# Patient Record
Sex: Male | Born: 1965 | Race: White | Hispanic: No
Health system: Midwestern US, Community
[De-identification: ages and names within clinical notes are randomized; demographics above are authoritative.]

## PROBLEM LIST (undated history)

## (undated) DIAGNOSIS — M5416 Radiculopathy, lumbar region: Secondary | ICD-10-CM

## (undated) DIAGNOSIS — M545 Low back pain, unspecified: Secondary | ICD-10-CM

## (undated) DIAGNOSIS — F429 Obsessive-compulsive disorder, unspecified: Secondary | ICD-10-CM

## (undated) DIAGNOSIS — T8859XA Other complications of anesthesia, initial encounter: Secondary | ICD-10-CM

## (undated) DIAGNOSIS — T4145XA Adverse effect of unspecified anesthetic, initial encounter: Secondary | ICD-10-CM

## (undated) DIAGNOSIS — G709 Myoneural disorder, unspecified: Secondary | ICD-10-CM

## (undated) DIAGNOSIS — F32A Depression, unspecified: Secondary | ICD-10-CM

## (undated) DIAGNOSIS — K269 Duodenal ulcer, unspecified as acute or chronic, without hemorrhage or perforation: Secondary | ICD-10-CM

## (undated) DIAGNOSIS — F419 Anxiety disorder, unspecified: Secondary | ICD-10-CM

## (undated) DIAGNOSIS — F909 Attention-deficit hyperactivity disorder, unspecified type: Secondary | ICD-10-CM

## (undated) DIAGNOSIS — F329 Major depressive disorder, single episode, unspecified: Secondary | ICD-10-CM

## (undated) DIAGNOSIS — F319 Bipolar disorder, unspecified: Secondary | ICD-10-CM

## (undated) HISTORY — DX: Depression, unspecified: F32.A

## (undated) HISTORY — DX: Myoneural disorder, unspecified: G70.9

## (undated) HISTORY — PX: COLON SURGERY: SHX602

## (undated) HISTORY — DX: Major depressive disorder, single episode, unspecified: F32.9

## (undated) HISTORY — PX: FRACTURE SURGERY: SHX138

## (undated) HISTORY — DX: Anxiety disorder, unspecified: F41.9

---
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
## 2008-06-07 HISTORY — PX: SHOULDER ARTHROSCOPY DISTAL CLAVICLE EXCISION AND OPEN ROTATOR CUFF REPAIR: SHX2396

## 2009-05-03 ENCOUNTER — Emergency Department (HOSPITAL_BASED_OUTPATIENT_CLINIC_OR_DEPARTMENT_OTHER): Admission: EM | Admit: 2009-05-03 | Discharge: 2009-05-03 | Payer: Self-pay | Admitting: Emergency Medicine

## 2009-05-03 ENCOUNTER — Ambulatory Visit: Payer: Self-pay | Admitting: Diagnostic Radiology

## 2011-02-06 ENCOUNTER — Encounter: Payer: Self-pay | Admitting: *Deleted

## 2011-02-06 ENCOUNTER — Emergency Department (HOSPITAL_BASED_OUTPATIENT_CLINIC_OR_DEPARTMENT_OTHER)
Admission: EM | Admit: 2011-02-06 | Discharge: 2011-02-06 | Disposition: A | Discharge status: Home / Self Care | Payer: Managed Care, Other (non HMO) | Attending: Emergency Medicine | Admitting: Emergency Medicine

## 2011-02-06 DIAGNOSIS — S81009A Unspecified open wound, unspecified knee, initial encounter: Secondary | ICD-10-CM | POA: Insufficient documentation

## 2011-02-06 DIAGNOSIS — Y9289 Other specified places as the place of occurrence of the external cause: Secondary | ICD-10-CM | POA: Insufficient documentation

## 2011-02-06 DIAGNOSIS — S81019A Laceration without foreign body, unspecified knee, initial encounter: Secondary | ICD-10-CM

## 2011-02-06 DIAGNOSIS — S91009A Unspecified open wound, unspecified ankle, initial encounter: Secondary | ICD-10-CM | POA: Insufficient documentation

## 2011-02-06 DIAGNOSIS — W268XXA Contact with other sharp object(s), not elsewhere classified, initial encounter: Secondary | ICD-10-CM | POA: Insufficient documentation

## 2011-02-06 MED ORDER — LIDOCAINE HCL 2 % IJ SOLN
15.0000 mL | Freq: Once | INTRAMUSCULAR | Status: AC
  Administered 2011-02-06: 22:00:00

## 2011-02-06 MED ORDER — LIDOCAINE HCL 2 % IJ SOLN
INTRAMUSCULAR | Status: AC
  Filled 2011-02-06: qty 1

## 2011-02-06 NOTE — ED Provider Notes (Signed)
Medical screening examination/treatment/procedure(s) were performed by non-physician practitioner and as supervising physician I was immediately available for consultation/collaboration.   Lorie Melichar, MD 02/06/11 2320 
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
## 2011-02-06 NOTE — ED Notes (Signed)
D/c home with instructions to have sutures out in 10-14 days- no rx given- no activity restrictions per EDNP Lonna Cobb

## 2011-02-06 NOTE — ED Notes (Signed)
Pt sutured by V. Pickering,EDNP
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
## 2011-11-06 ENCOUNTER — Emergency Department (HOSPITAL_BASED_OUTPATIENT_CLINIC_OR_DEPARTMENT_OTHER)
Admission: EM | Admit: 2011-11-06 | Discharge: 2011-11-06 | Disposition: A | Discharge status: Home / Self Care | Payer: Managed Care, Other (non HMO) | Attending: Emergency Medicine | Admitting: Emergency Medicine

## 2011-11-06 ENCOUNTER — Encounter (HOSPITAL_BASED_OUTPATIENT_CLINIC_OR_DEPARTMENT_OTHER): Payer: Self-pay | Admitting: *Deleted

## 2011-11-06 DIAGNOSIS — F909 Attention-deficit hyperactivity disorder, unspecified type: Secondary | ICD-10-CM | POA: Insufficient documentation

## 2011-11-06 DIAGNOSIS — F429 Obsessive-compulsive disorder, unspecified: Secondary | ICD-10-CM | POA: Insufficient documentation

## 2011-11-06 DIAGNOSIS — Z760 Encounter for issue of repeat prescription: Secondary | ICD-10-CM | POA: Insufficient documentation

## 2011-11-06 DIAGNOSIS — F319 Bipolar disorder, unspecified: Secondary | ICD-10-CM | POA: Insufficient documentation

## 2011-11-06 HISTORY — DX: Obsessive-compulsive disorder, unspecified: F42.9

## 2011-11-06 HISTORY — DX: Bipolar disorder, unspecified: F31.9

## 2011-11-06 HISTORY — DX: Attention-deficit hyperactivity disorder, unspecified type: F90.9

## 2011-11-06 MED ORDER — AMPHETAMINE-DEXTROAMPHETAMINE 20 MG PO TABS: ORAL_TABLET | ORAL | Status: DC

## 2011-11-06 NOTE — ED Notes (Signed)
Pt reports he is out of adderall- laast dose at 1200 today- pt's PA called and spoke with Karen Kitchens, RN regarding pt's situation

## 2011-11-06 NOTE — Discharge Instructions (Signed)
Medication Refill, Emergency Department We have refilled your medication today as a courtesy to you. It is best for your medical care, however, to take care of getting refills done through your primary caregiver's office. They have your records and can do a better job of follow-up than we can in the emergency department. On maintenance medications, we often only prescribe enough medications to get you by until you are able to see your regular caregiver. This is a more expensive way to refill medications. In the future, please plan for refills so that you will not have to use the emergency department for this. Thank you for your help. Your help allows [place] to better take care of the daily emergencies that enter our department. Document Released: 09/10/2003 Document Revised: 05/13/2011 Document Reviewed: 05/24/2005 [facility] Patient Information 2012 [place], [place].   Take the adderal as prescribed.  Follow up with your MD as planned.

## 2011-11-06 NOTE — ED Provider Notes (Signed)
History     CSN: [id number]  Arrival date & time 11/06/11  1943   First MD Initiated Contact with Patient 11/06/11 2050      Chief Complaint  Patient presents with  . Medication Refill    (Consider location/radiation/quality/duration/timing/severity/associated sxs/prior treatment) HPI Comments: Pt ran out of adderal.  His prescriber called the ED to inform us that he is a legitimate pt of their practice and does indeed need the medicine.  The history is provided by the patient. No language interpreter was used.    Past Medical History  Diagnosis Date  . ADHD (attention deficit hyperactivity disorder)   . Bipolar 1 disorder   . OCD (obsessive compulsive disorder)     Past Surgical History  Procedure Date  . Arm surgery     No family history on file.  History  Substance Use Topics  . Smoking status: Never Smoker   . Smokeless tobacco: Current User    Types: Snuff  . Alcohol Use: 3.0 oz/week    5 Cans of beer per week      Review of Systems  Psychiatric/Behavioral: Negative for suicidal ideas and sleep disturbance. The patient is nervous/anxious.   All other systems reviewed and are negative.    Allergies  Review of patient's allergies indicates no known allergies.  Home Medications   Current Outpatient Rx  Name Route Sig Dispense Refill  . ALPRAZOLAM 1 MG PO TABS Oral Take 1 mg by mouth 3 (three) times daily as needed. For anxiety    . AMPHETAMINE-DEXTROAMPHETAMINE 20 MG PO TABS Oral Take 20 mg by mouth 3 (three) times daily.    Marland Kitchen CLONAZEPAM 0.5 MG PO TABS Oral Take 0.5 mg by mouth 3 (three) times daily as needed. For anxiety    . IBUPROFEN 200 MG PO TABS Oral Take 800 mg by mouth 3 (three) times daily as needed. For pain    . LAMOTRIGINE ER 25 MG PO TB24 Oral Take 25 mg by mouth daily.    . VENLAFAXINE HCL ER 150 MG PO CP24 Oral Take 150 mg by mouth daily.    . AMPHETAMINE-DEXTROAMPHETAMINE 20 MG PO TABS  One tab po QID 120 tablet 0    BP 139/81   Pulse 64  Temp 98.3 F (36.8 C)  Resp 18  Ht 6\' 2"  (1.88 m)  Wt 215 lb (97.523 kg)  BMI 27.60 kg/m2  SpO2 100%  Physical Exam  Nursing note and vitals reviewed. Constitutional: He is oriented to person, place, and time. He appears well-developed and well-nourished.  HENT:  Head: Normocephalic and atraumatic.  Eyes: EOM are normal.  Neck: Normal range of motion.  Cardiovascular: Normal rate, regular rhythm, normal heart sounds and intact distal pulses.   Pulmonary/Chest: Effort normal and breath sounds normal. No respiratory distress.  Abdominal: Soft. He exhibits no distension. There is no tenderness.  Musculoskeletal: Normal range of motion.  Neurological: He is alert and oriented to person, place, and time.  Skin: Skin is warm and dry.  Psychiatric: His speech is normal and behavior is normal. Judgment and thought content normal. His mood appears anxious. His affect is not angry, not blunt, not labile and not inappropriate. Thought content is not paranoid and not delusional. He does not exhibit a depressed mood. He expresses no homicidal and no suicidal ideation.    ED Course  Procedures (including critical care time)  Labs Reviewed - No data to display No results found.   1. Prescription refill  MDM  rx-adderal 20 mg QID, 120 F/u with your MD as planned.      Pt ra  Worthy Rancher, [place] 11/06/11 2138

## 2011-11-06 NOTE — ED Notes (Signed)
rx x 1 given for adderral

## 2011-11-06 NOTE — ED Provider Notes (Signed)
Medical screening examination/treatment/procedure(s) were performed by non-physician practitioner and as supervising physician I was immediately available for consultation/collaboration.   Coltyn Hanning, MD 11/06/11 2326 

## 2011-11-07 ENCOUNTER — Emergency Department (HOSPITAL_BASED_OUTPATIENT_CLINIC_OR_DEPARTMENT_OTHER)
Admission: EM | Admit: 2011-11-07 | Discharge: 2011-11-08 | Disposition: A | Discharge status: Home / Self Care | Payer: Managed Care, Other (non HMO) | Attending: Emergency Medicine | Admitting: Emergency Medicine

## 2011-11-07 ENCOUNTER — Encounter (HOSPITAL_BASED_OUTPATIENT_CLINIC_OR_DEPARTMENT_OTHER): Payer: Self-pay | Admitting: *Deleted

## 2011-11-07 DIAGNOSIS — R0682 Tachypnea, not elsewhere classified: Secondary | ICD-10-CM | POA: Insufficient documentation

## 2011-11-07 DIAGNOSIS — F3110 Bipolar disorder, current episode manic without psychotic features, unspecified: Secondary | ICD-10-CM

## 2011-11-07 DIAGNOSIS — F22 Delusional disorders: Secondary | ICD-10-CM

## 2011-11-07 DIAGNOSIS — F411 Generalized anxiety disorder: Secondary | ICD-10-CM | POA: Insufficient documentation

## 2011-11-07 LAB — CBC
HCT: 44.2 % (ref 39.0–52.0)
Hemoglobin: 16.2 g/dL (ref 13.0–17.0)
MCH: 31.9 pg (ref 26.0–34.0)
MCHC: 36.7 g/dL — ABNORMAL HIGH (ref 30.0–36.0)
MCV: 87 fL (ref 78.0–100.0)
Platelets: 148 K/uL — ABNORMAL LOW (ref 150–400)
RBC: 5.08 MIL/uL (ref 4.22–5.81)
RDW: 12.8 % (ref 11.5–15.5)
WBC: 6.5 10*3/uL (ref 4.0–10.5)

## 2011-11-07 LAB — BASIC METABOLIC PANEL WITH GFR
Calcium: 9.2 mg/dL (ref 8.4–10.5)
GFR calc non Af Amer: 89 mL/min — ABNORMAL LOW (ref >90)
Sodium: 138 meq/L (ref 135–145)

## 2011-11-07 LAB — RAPID URINE DRUG SCREEN, HOSP PERFORMED
Amphetamines: POSITIVE — AB
Barbiturates: NOT DETECTED
Benzodiazepines: NOT DETECTED
Cocaine: NOT DETECTED
Opiates: NOT DETECTED
Tetrahydrocannabinol: NOT DETECTED

## 2011-11-07 LAB — BASIC METABOLIC PANEL
BUN: 19 mg/dL (ref 6–23)
CO2: 30 mEq/L (ref 19–32)
Chloride: 100 mEq/L (ref 96–112)
Creatinine, Ser: 1 mg/dL (ref 0.50–1.35)
GFR calc Af Amer: >90 mL/min (ref >90)
Glucose, Bld: 100 mg/dL — ABNORMAL HIGH (ref 70–99)
Potassium: 3.8 mEq/L (ref 3.5–5.1)

## 2011-11-07 LAB — ETHANOL: Alcohol, Ethyl (B): <11 mg/dL (ref 0–11)

## 2011-11-07 MED ORDER — ACETAMINOPHEN 325 MG PO TABS
650.0000 mg | ORAL_TABLET | ORAL | Status: DC | PRN
  Administered 2011-11-07: 650 mg via ORAL
  Filled 2011-11-07: qty 2

## 2011-11-07 MED ORDER — NICOTINE 21 MG/24HR TD PT24
21.0000 mg | MEDICATED_PATCH | Freq: Every day | TRANSDERMAL | Status: DC
  Administered 2011-11-07: 21 mg via TRANSDERMAL
  Filled 2011-11-07 (×2): qty 1

## 2011-11-07 MED ORDER — LORAZEPAM 1 MG PO TABS
1.0000 mg | ORAL_TABLET | Freq: Three times a day (TID) | ORAL | Status: DC | PRN
  Administered 2011-11-07 – 2011-11-08 (×3): 1 mg via ORAL
  Filled 2011-11-07 (×3): qty 1

## 2011-11-07 MED ORDER — ALUM & MAG HYDROXIDE-SIMETH 200-200-20 MG/5ML PO SUSP: 30.0000 mL | ORAL | Status: DC | PRN

## 2011-11-07 MED ORDER — ONDANSETRON HCL 4 MG PO TABS: 4.0000 mg | ORAL_TABLET | Freq: Three times a day (TID) | ORAL | Status: DC | PRN

## 2011-11-07 NOTE — ED Notes (Signed)
[facility] staff here speaking with pt

## 2011-11-07 NOTE — ED Notes (Signed)
Wife, Loranzo Desha can be reached at [phone]

## 2011-11-07 NOTE — ED Notes (Signed)
Pt drinking Gatorade in an attempt to provide UA

## 2011-11-07 NOTE — ED Notes (Signed)
Report to [place], [place]

## 2011-11-07 NOTE — ED Notes (Signed)
EDP Ricardo Robbins notified that [facility] is working on placing pt and has already assessed the pt

## 2011-11-07 NOTE — ED Notes (Signed)
Visitor at bedside is Clance Boll, [place] with [facility] [employer] (according to badge she is wearing); of note, she introduced herself to me twice within a short time time period (approx 20 min) in the same manner, offering her hand to shake while stating, "I'm Marisue Ivan".  The second time she did this, she interrupted pt while he was talking.  She then told me, "His wife is [place]"

## 2011-11-07 NOTE — ED Provider Notes (Signed)
History     CSN: [id number]  Arrival date & time 11/07/11  0504   First MD Initiated Contact with Patient 11/07/11 [phone]      Chief Complaint  Patient presents with  . Medical Clearance    (Consider location/radiation/quality/duration/timing/severity/associated sxs/prior treatment) HPI Comments: Level 5 caveat due to psychiatric complaint and behavior.  Pt has had worsening problems with sleep, working too long hours, up to 300 hrs in 3 weeks, inability to function at work and having thoughts that others are conspiring against him.  He has purposefully trying to do a poor performance recently in order to get fired.  Physically, he is tired, has had episodes of nearly passing out but no HA, CP, SOB.  No abd pain, fevers, vomiting, diarrhea.  He also has been intermittently skipping or running out of his meds for past several months.  Last saw his psychiatrist about 4-5 months ago, missed his last appt on Friday.    The history is provided by the patient, a friend and the spouse.    Past Medical History  Diagnosis Date  . ADHD (attention deficit hyperactivity disorder)   . Bipolar 1 disorder   . OCD (obsessive compulsive disorder)     Past Surgical History  Procedure Date  . Arm surgery     History reviewed. No pertinent family history.  History  Substance Use Topics  . Smoking status: Never Smoker   . Smokeless tobacco: Current User    Types: Snuff  . Alcohol Use: 3.0 oz/week    5 Cans of beer per week      Review of Systems  Unable to perform ROS: Psychiatric disorder    Allergies  Review of patient's allergies indicates no known allergies.  Home Medications   Current Outpatient Rx  Name Route Sig Dispense Refill  . ALPRAZOLAM 1 MG PO TABS Oral Take 1 mg by mouth 3 (three) times daily as needed. For anxiety    . AMPHETAMINE-DEXTROAMPHETAMINE 20 MG PO TABS Oral Take 20 mg by mouth 3 (three) times daily.    . AMPHETAMINE-DEXTROAMPHETAMINE 20 MG PO TABS  One tab  po QID 120 tablet 0  . CLONAZEPAM 0.5 MG PO TABS Oral Take 0.5 mg by mouth 3 (three) times daily as needed. For anxiety    . IBUPROFEN 200 MG PO TABS Oral Take 800 mg by mouth 3 (three) times daily as needed. For pain    . LAMOTRIGINE ER 25 MG PO TB24 Oral Take 25 mg by mouth daily.    . VENLAFAXINE HCL ER 150 MG PO CP24 Oral Take 150 mg by mouth daily.      BP 150/87  Pulse 60  Temp(Src) 97.9 F (36.6 C) (Oral)  Resp 18  SpO2 97%  Physical Exam  Nursing note and vitals reviewed. Constitutional: He appears well-developed and well-nourished. He appears distressed.  HENT:  Head: Normocephalic and atraumatic.  Eyes: Conjunctivae and EOM are normal. Pupils are equal, round, and reactive to light. No scleral icterus.  Neck: Neck supple.  Cardiovascular: Normal rate.   Pulmonary/Chest: Breath sounds normal. Tachypnea noted. No respiratory distress. He has no wheezes.  Abdominal: Soft.  Musculoskeletal: He exhibits no edema.       Atrophy of left hand due to prior injury and surgery  Neurological: He is alert. No cranial nerve deficit. GCS eye subscore is 4. GCS verbal subscore is 5. GCS motor subscore is 6.  Skin: Skin is warm. He is diaphoretic.  Psychiatric: His mood appears  anxious. His affect is labile. His speech is rapid and/or pressured, delayed and tangential. He is agitated. Thought content is paranoid and delusional. He expresses impulsivity and inappropriate judgment. He expresses no homicidal and no suicidal ideation. He expresses no suicidal plans and no homicidal plans.    ED Course  Procedures (including critical care time)   Labs Reviewed  CBC  BASIC METABOLIC PANEL  ETHANOL  URINE RAPID DRUG SCREEN (HOSP PERFORMED)   No results found.   1. Bipolar I disorder with mania   2. Paranoid delusion       MDM  Pt with manic features along with paranoia, self destructive behavior recently at work.  I recommend inpatient treatment for uncontrolled mania and bipolar  disorder with paranoid features. Will get drug screen, labs, and consult [facility] to see pt here.  Pt is signed off to Dr. Fonnie Jarvis.          Gavin Pound. Resa Rinks, MD 11/07/11 1610

## 2011-11-07 NOTE — ED Notes (Signed)
Wife just left

## 2011-11-07 NOTE — ED Notes (Signed)
Ricardo Robbins with [facility] has updated pt on progress.  Information has been faxed to inpatient, awaiting acceptance and  placement.

## 2011-11-07 NOTE — ED Notes (Signed)
[facility] staff still at bedside

## 2011-11-07 NOTE — ED Notes (Signed)
Misty Stanley with [facility] has evaluated pt and is presently seeking in-patient placement for pt.  Voluntary admission.

## 2011-11-07 NOTE — ED Provider Notes (Addendum)
Seen by [facility] counselor who recs [facility] and working on voluntary (Pt agrees) transfer arrangements.1130 [facility] currently full, but Pt still on waiting list there as well as under review at multiple other facilities per [facility]. 1525  Hurman Horn, MD 11/07/11 1528  Pt anxious but cooperative; since no [facility] beds available will transfer to [facility] ED for ongoing ED voluntary psych hold while awaiting inpatient psych bed availability.  Pt agrees to transfer to [facility] ED.1630  Hurman Horn, MD 11/07/11 [phone]

## 2011-11-07 NOTE — ED Notes (Signed)
Per EDP Freida Busman, pt may have a nicotine patch.

## 2011-11-07 NOTE — ED Notes (Signed)
Pt was seen earlier after receiving a call from an individual that identified herself as a PA with [facility]. This individual stated that the pt was on adderall and ran out of his rx and missed his appointment on Friday. Pt was advised to go to ED for his rx since offices were closed. This individual led this RN to believe that she was pts PCP and prescriber of his meds. Pt returns to ED because he "lost about 6 hours" after leaving the ED. Pt is talking rapidly and at times incoherently. Pt states that he left here to get his rx filled. Pt pulled his car over to write an "email" and woke up several hours later. Pt reports several episodes of syncope in the last week. Pt has been out of meds x one week. Shortly after pt's arrival to ED the individual who identified herself as a PA with [facility] arrived and stated that she was in fact a friend, and had been boating with pt and witnessed a "pre- syncopal" episode earlier in the day (Sat), and that she feels like something" organic" is going on.

## 2011-11-07 NOTE — [facility] Counselor (Signed)
Telepsych consult ordered and info faxed to [facility].

## 2011-11-07 NOTE — ED Notes (Signed)
[id number]<FA> Expected date:<BR> Expected time:<BR> Means of arrival:<BR> Comments:<BR> Hold for charge

## 2011-11-07 NOTE — ED Notes (Signed)
I received a call from someone who stated that they were Mr Kattner PA who wanted to speak with someone about his care. I transferred that phone call to the charge nurse, Karen Kitchens.

## 2011-11-07 NOTE — ED Notes (Signed)
Pt family/friend/PA   reported  to RN station requesting medication for pt. States that pt has a HA.  This RN was giving report to [facility] team and then to oncoming RN when family friend reported again stating that pt needed something to calm his nerves as well. Informed friend that he did have PRN orders  And that I would bring those in shortly. Friend appeared upset and asked if I knew how long it would be, I responded that I would bring medication after giving the oncoming RN report.

## 2011-11-07 NOTE — ED Notes (Addendum)
Pt's wife took his belongings.

## 2011-11-08 MED ORDER — VENLAFAXINE HCL ER 150 MG PO CP24: 150.0000 mg | ORAL_CAPSULE | Freq: Every day | ORAL | Status: DC

## 2011-11-08 MED ORDER — LAMOTRIGINE 25 MG PO TABS: 25.0000 mg | ORAL_TABLET | Freq: Every day | ORAL | Status: DC

## 2011-11-08 NOTE — [facility] Counselor (Addendum)
Writer spoke with pt to discuss telepsych's consult recommendation for d/c. Pt wishes to remain in psych ed with hope of being admitted inpatient somewhere. Writer relayed info from [place] at [employer] that pt had been declined by 2 hospitals so far due to not meeting inpt criteria. Writer told pt that pt could possibly be d/c today and to follow up with his psychiatrist if no bed is found for pt. Writer spoke with Misty Stanley at [facility] to tell her telepsych results and told her pt's request. Misty Stanley indicates she will continue to work on placing pt.

## 2011-11-08 NOTE — Discharge Instructions (Signed)
Mood Disorders Mood disorders are conditions that affect the way a person feels emotionally. The main mood disorders include:  Depression.   Bipolar disorder.   Dysthymia. Dysthymia is a mild, lasting (chronic) depression. Symptoms of dysthymia are similar to depression, but not as severe.   Cyclothymia. Cyclothymia includes mood swings, but the highs and lows are not as severe as they are in bipolar disorder. Symptoms of cyclothymia are similar to those of bipolar disorder, but less extreme.  CAUSES  Mood disorders are probably caused by a combination of factors. People with mood disorders seem to have physical and chemical changes in their brains. Mood disorders run in families, so there may be genetic causes. Severe trauma or stressful life events may also increase the risk of mood disorders.  SYMPTOMS  Symptoms of mood disorders depend on the specific type of condition. Depression symptoms include:  Feeling sad, worthless, or hopeless.   Negative thoughts.   Inability to enjoy one's usual activities.   Low energy.   Sleeping too much or too little.   Appetite changes.   Crying.   Concentration problems.   Thoughts of harming oneself.  Bipolar disorder symptoms include:  Periods of depression (see above symptoms).   Mood swings, from sadness and depression, to abnormal elation and excitement.   Periods of mania:   Racing thoughts.   Fast speech.   Poor judgment, and careless, dangerous choices.   Decreased need for sleep.   Risky behavior.   Difficulty concentrating.   Irritability.   Increased energy.   Increased sex drive.  DIAGNOSIS  There are no blood tests or X-rays that can confirm a mood disorder. However, your caregiver may choose to run some tests to make sure that there is not another physical cause for your symptoms. A mood disorder is usually diagnosed after an in-depth interview with a caregiver. TREATMENT  Mood disorders can be treated  with one or more of the following:  Medicine. This may include antidepressants, mood-stabilizers, or anti-psychotics.   Psychotherapy (talk therapy).   Cognitive behavioral therapy. You are taught to recognize negative thoughts and behavior patterns, and replace them with healthy thoughts and behaviors.   Electroconvulsive therapy. For very severe cases of deep depression, a series of treatments in which an electrical current is applied to the brain.   Vagus nerve stimulation. A pulse of electricity is applied to a portion of the brain.   Transcranial magnetic stimulation. Powerful magnets are placed on the head that produce electrical currents.   Hospitalization. In severe situations, or when someone is having serious thoughts of harming him or herself, hospitalization may be necessary in order to keep the person safe. This is also done to quickly start and monitor treatment.  HOME CARE INSTRUCTIONS   Take your medicine exactly as directed.   Attend all of your therapy sessions.   Try to eat regular, healthy meals.   Exercise daily. Exercise may improve mood symptoms.   Get good sleep.   Do not drink alcohol or use pot or other drugs. These can worsen mood symptoms and cause anxiety and psychosis.   Tell your caregiver if you develop any side effects, such as feeling sick to your stomach (nauseous), dry mouth, dizziness, constipation, drowsiness, tremor, weight gain, or sexual symptoms. He or she may suggest things you can do to improve symptoms.   Learn ways to cope with the stress of having a chronic illness. This includes yoga, meditation, tai chi, or participating in a support  group.   Drink enough water to keep your urine clear or pale yellow. Eat a high-fiber diet. These habits may help you avoid constipation from your medicine.  SEEK IMMEDIATE MEDICAL CARE IF:  Your mood worsens.   You have thoughts of hurting yourself or others.   You cannot care for yourself.   You  develop the sensation of hearing or seeing something that is not actually present (auditory or visual hallucinations).   You develop abnormal thoughts.  Document Released: 03/21/2009 Document Revised: 05/13/2011 Document Reviewed: 03/21/2009 [facility] Patient Information 2012 [place], [place].  RESOURCE GUIDE  Chronic Pain Problems: Contact [facility]  [phone] Patients need to be referred by their primary care doctor.  Insufficient Money for Medicine: Contact United Way:  call "211" or [facility] [phone].  No Primary Care Doctor: - Call [facility] Connect  [phone] - can help you locate a primary care doctor that  accepts your insurance, provides certain services, etc. - Physician Referral Service- [phone]  Agencies that provide inexpensive medical care: - [facility]  [phone] - [facility]  [phone] - [facility]  [phone] - Women's Clinic  [phone] - Planned Parenthood  [phone] [facility]  [phone]  Medicaid-accepting [facility] Providers: - [facility]- [address]  [phone], Mon-Fri 9am-7pm, Sat 9am-1pm - [facility]- [address] [place], [address] [place]  [phone] - [facility]- [address] [place]  [phone] [facility]- [address]  [phone] - Renaye Rakers- [address] [place], [address], [phone]  Only accepts [place] patients after they have their name  applied to their card  Self Pay (no insurance) in [place]: - Sickle Cell Patients: Dr Willey Blade, [facility]  [address] [place], [phone] - [facility]- [address] [place]  [phone]       [facility] [place]- [address]       -     [facility]- see information above (Speak to [employer] if you do not have  insurance)       -  [facility]- [address] [place], [phone]       -  [facility]- [address],  [phone]       -  [facility]- [address] [phone]       -  Dr Julio Sicks-  [address], [place], [phone]       -  [facility]- [address], [phone]       -  [facility]- [address] [phone], also [address] [phone]       -    [facility]- [address] [place], [phone], 1st & 3rd Saturday   every month, 10am-1pm  1) Find a Doctor and Pay Out of Pocket Although you won't have to find out who is covered by your insurance plan, it is a good idea to ask around and get recommendations. You will then need to call the office and see if the doctor you have chosen will accept you as a new patient and what types of options they offer for patients who are self-pay. Some doctors offer discounts or will  set up payment plans for their patients who do not have insurance, but you will need to ask so you aren't surprised when you get to your appointment.  2) Contact Your Local Health Department Not all health departments have doctors that can see patients for sick visits, but many do, so it is worth a call to see if yours does. If you don't know where your local health department is, you can check in your phone book. The CDC also has a tool to help you locate your state's health department, and many state websites also have listings of all of their local health departments.  3) Find a Walk-in Clinic If your illness is not likely to be very severe or complicated, you may want to try a walk in clinic. These are popping up all over the country in pharmacies, drugstores, and shopping centers. They're usually staffed by nurse practitioners or physician assistants that have been trained to treat common illnesses and complaints. They're usually fairly quick and inexpensive. However, if you have serious  medical issues or chronic medical problems, these are probably not your best option  STD Testing - [facility] Department of [facility], STD Clinic, [address] [place], phone [phone] or [phone].  Monday - Friday, call for an appointment. [facility] Department of [employer], STD Clinic, [address], [place], phone [phone] or [phone].  Monday - Friday, call for an appointment.  Abuse/Neglect: [facility] Child Abuse Hotline 937-045-7516 [facility] Child Abuse Hotline 2347677295 (After Hours)  Emergency Shelter:  [facility] [phone]  Maternity Homes: - [facility] 3148523653 - [facility] [phone]  MRSA Hotline #:   786 747 8419  [facility]  [facility] [place]  United Way [facility] Dept. [address]                 [address]         [address] [place] [address]  Blondell Reveal Phone:  [phone]                                  Phone:  [phone]                   Phone:  [phone]  [facility] Mental Health, [phone] - [facility] - [phone]       -     [facility] in [place], [address]                                  [phone], Insurance  [place] Child Abuse Hotline 586 004 8879 or 580-236-0760 (After Hours)   Behavioral Health Services  Substance Abuse Resources: - [facility] Drug Services  [phone] - [facility] [phone] - [facility] [phone] Floydene Flock [phone] - Residential & Outpatient Substance Abuse Program  [phone]  Psychological Services: [facility]  [phone] Services  [phone] - [facility], [phone] [place]. [address], [place], ACCESS LINE: 2608069348 or 5165007315, [email or web address]  Dental Assistance  If unable to pay or uninsured, contact:  [facility] or [facility]. to become qualified for the adult dental clinic.  Patients with Medicaid: [facility] [phone] [address], [phone] [address], [phone]  If unable to pay, or uninsured, contact [facility] [phone]) or [facility] Department [phone] in [place], [phone] in [facility]) to become qualified for the adult dental clinic  Other Low-Cost Community Dental Services: - [facility]- [address] [place], [place], [place], [address], [phone], [address]. [address], 2nd and 4th Thursday of the month at 6:30am.  10 clients each day by appointment, can sometimes see walk-in patients if someone does not show for an appointment. [facility]- [address] [place], [place], [address], [phone] - [facility]- [address], [place], [place], [address], [phone] - [facility]- [phone] [facility] Health Department- [phone] [facility] Department- [phone]

## 2011-11-08 NOTE — BHH Counselor (Addendum)
Writer spoke w Misty Stanley at [employer] [phone] who is responsible for placing pt. Misty Stanley reports that pt has been declined at 2 hospitals for failure to meet inpatient criteria. Writer will have telepsych consult ordered to help determine pt disposition and keep Misty Stanley apprised of any new info.

## 2011-11-08 NOTE — ED Notes (Signed)
Pt up at window inquiring about shower. Provided toiletries and clean scrubs for patient. Cleaned and changed linen while pt showering.

## 2011-11-08 NOTE — ED Provider Notes (Signed)
Filed Vitals:   11/08/11 0629  BP: 133/67  Pulse: 52  Temp: 97.7 F (36.5 C)  Resp: 18   Pt seen and assessed. Pt does show some manic features but this does not seem to be an acute issue and he has been taking his medications intermittently. Per notes, pt already has had telepsych eval which apparently recommending DC. Cannot locate copy of this currently though. Pt has been declined at several facilities because does not meet criteria. One psych consult confirmed plan to DC with their medication recommendations.  Raeford Razor, MD 11/08/11 [phone]

## 2011-12-27 ENCOUNTER — Observation Stay (HOSPITAL_COMMUNITY): Payer: Managed Care, Other (non HMO)

## 2011-12-27 ENCOUNTER — Encounter (HOSPITAL_COMMUNITY): Payer: Self-pay | Admitting: General Practice

## 2011-12-27 ENCOUNTER — Observation Stay (HOSPITAL_COMMUNITY)
Admission: AD | Admit: 2011-12-27 | Discharge: 2011-12-28 | Disposition: A | Discharge status: Home / Self Care | Payer: Managed Care, Other (non HMO) | Source: Ambulatory Visit | Attending: Surgery | Admitting: Surgery

## 2011-12-27 DIAGNOSIS — L02419 Cutaneous abscess of limb, unspecified: Secondary | ICD-10-CM

## 2011-12-27 DIAGNOSIS — Z792 Long term (current) use of antibiotics: Secondary | ICD-10-CM | POA: Insufficient documentation

## 2011-12-27 DIAGNOSIS — L03119 Cellulitis of unspecified part of limb: Secondary | ICD-10-CM

## 2011-12-27 LAB — BASIC METABOLIC PANEL
BUN: 23 mg/dL (ref 6–23)
CO2: 30 mEq/L (ref 19–32)
Calcium: 9.2 mg/dL (ref 8.4–10.5)
Chloride: 102 mEq/L (ref 96–112)
Creatinine, Ser: 1.09 mg/dL (ref 0.50–1.35)
GFR calc Af Amer: >90 mL/min (ref >90)
GFR calc non Af Amer: 80 mL/min — ABNORMAL LOW (ref >90)
Glucose, Bld: 94 mg/dL (ref 70–99)
Potassium: 4.7 mEq/L (ref 3.5–5.1)
Sodium: 140 mEq/L (ref 135–145)

## 2011-12-27 LAB — CBC
HCT: 44.7 % (ref 39.0–52.0)
Hemoglobin: 15.9 g/dL (ref 13.0–17.0)
MCH: 31.8 pg (ref 26.0–34.0)
MCHC: 35.6 g/dL (ref 30.0–36.0)
MCV: 89.4 fL (ref 78.0–100.0)
Platelets: 151 10*3/uL (ref 150–400)
RBC: 5 MIL/uL (ref 4.22–5.81)
RDW: 13.1 % (ref 11.5–15.5)
WBC: 8.6 10*3/uL (ref 4.0–10.5)

## 2011-12-27 MED ORDER — FAMOTIDINE 40 MG PO TABS
40.0000 mg | ORAL_TABLET | Freq: Two times a day (BID) | ORAL | Status: DC
  Administered 2011-12-27: 40 mg via ORAL
  Filled 2011-12-27 (×3): qty 1

## 2011-12-27 MED ORDER — ALPRAZOLAM 0.5 MG PO TABS: 1.0000 mg | ORAL_TABLET | Freq: Two times a day (BID) | ORAL | Status: DC | PRN

## 2011-12-27 MED ORDER — CALCIUM CARBONATE ANTACID 500 MG PO CHEW
400.0000 mg | CHEWABLE_TABLET | Freq: Three times a day (TID) | ORAL | Status: DC
  Administered 2011-12-27: 400 mg via ORAL
  Filled 2011-12-27 (×4): qty 2

## 2011-12-27 MED ORDER — VANCOMYCIN HCL IN DEXTROSE 1-5 GM/200ML-% IV SOLN
1000.0000 mg | Freq: Three times a day (TID) | INTRAVENOUS | Status: DC
  Administered 2011-12-28 (×2): 1000 mg via INTRAVENOUS
  Filled 2011-12-27 (×3): qty 200

## 2011-12-27 MED ORDER — ONDANSETRON HCL 4 MG/2ML IJ SOLN: 4.0000 mg | Freq: Four times a day (QID) | INTRAMUSCULAR | Status: DC | PRN

## 2011-12-27 MED ORDER — DIPHENHYDRAMINE HCL 50 MG/ML IJ SOLN: 12.5000 mg | Freq: Four times a day (QID) | INTRAMUSCULAR | Status: DC | PRN

## 2011-12-27 MED ORDER — VANCOMYCIN HCL IN DEXTROSE 1-5 GM/200ML-% IV SOLN
1000.0000 mg | INTRAVENOUS | Status: AC
  Administered 2011-12-27: 1000 mg via INTRAVENOUS
  Filled 2011-12-27: qty 200

## 2011-12-27 MED ORDER — IBUPROFEN 800 MG PO TABS
800.0000 mg | ORAL_TABLET | ORAL | Status: DC | PRN
  Administered 2011-12-28: 800 mg via ORAL
  Filled 2011-12-27: qty 1

## 2011-12-27 MED ORDER — DIPHENHYDRAMINE HCL 12.5 MG/5ML PO ELIX
12.5000 mg | ORAL_SOLUTION | Freq: Four times a day (QID) | ORAL | Status: DC | PRN
  Filled 2011-12-27: qty 10

## 2011-12-27 MED ORDER — DEXTROSE 5 % IV SOLN
1.0000 g | INTRAVENOUS | Status: AC
  Administered 2011-12-27: 1 g via INTRAVENOUS
  Filled 2011-12-27: qty 1

## 2011-12-27 MED ORDER — DEXTROSE 5 % IV SOLN
1.0000 g | Freq: Three times a day (TID) | INTRAVENOUS | Status: DC
  Administered 2011-12-28 (×2): 1 g via INTRAVENOUS
  Filled 2011-12-27 (×3): qty 1

## 2011-12-27 MED ORDER — VENLAFAXINE HCL ER 150 MG PO CP24
150.0000 mg | ORAL_CAPSULE | Freq: Every day | ORAL | Status: DC
  Administered 2011-12-28: 150 mg via ORAL
  Filled 2011-12-27 (×2): qty 1

## 2011-12-27 MED ORDER — LAMOTRIGINE 25 MG PO TABS
50.0000 mg | ORAL_TABLET | Freq: Two times a day (BID) | ORAL | Status: DC
  Administered 2011-12-27: 50 mg via ORAL
  Filled 2011-12-27 (×3): qty 2

## 2011-12-27 MED ORDER — AMPHETAMINE-DEXTROAMPHETAMINE 10 MG PO TABS
20.0000 mg | ORAL_TABLET | Freq: Three times a day (TID) | ORAL | Status: DC
  Administered 2011-12-27 – 2011-12-28 (×2): 20 mg via ORAL
  Filled 2011-12-27 (×2): qty 2

## 2011-12-27 MED ORDER — CLONAZEPAM 0.5 MG PO TABS
0.5000 mg | ORAL_TABLET | Freq: Three times a day (TID) | ORAL | Status: DC | PRN
  Administered 2011-12-28: 0.5 mg via ORAL
  Filled 2011-12-27: qty 1

## 2011-12-27 MED ORDER — OXYCODONE HCL 5 MG PO TABS
5.0000 mg | ORAL_TABLET | ORAL | Status: DC | PRN
  Administered 2011-12-27 (×2): 5 mg via ORAL
  Filled 2011-12-27 (×2): qty 1

## 2011-12-27 MED ORDER — IOHEXOL 300 MG/ML  SOLN
100.0000 mL | Freq: Once | INTRAMUSCULAR | Status: AC | PRN
  Administered 2011-12-27: 100 mL via INTRAVENOUS

## 2011-12-27 NOTE — H&P (Signed)
Ricardo Robbins is an 46 y.o. male.   Chief Complaint: Right leg cellulitis HPI: 46 yr old healthy male who was working in his yard Saturday.  He felt a painful area near his right ankle but continued to work.  He felt pain and burning in the this area all night and woke up Sunday morning with significant redness and a center area that had a Ricardo Robbins/yellow looking head to it.  He contacted [place] and we started him on doxycycline but throughout the day it progressively got worse.  He was evaluated Sunday night and an attempt to aspirate the center was made but nothing was expressed.  He stayed on the doxy however the erythema spread another 2 inches from the Sunday night border therefore the patient contacted [place] again.  We felt that given the aggressiveness of the cellulitis the patient would benefit from IV antibiotics as well as imaging of the right leg to evaluate for more severe condition.  The patient has had no other symptoms other then some mild fatigue.    Past Medical History  Diagnosis Date  . ADHD (attention deficit hyperactivity disorder)   . Bipolar 1 disorder   . OCD (obsessive compulsive disorder)     Past Surgical History  Procedure Date  . Arm surgery     No family history on file. Social History:  reports that he has never smoked. His smokeless tobacco use includes Snuff. He reports that he drinks about 3 ounces of alcohol per week. He reports that he does not use illicit drugs.  Allergies: No Known Allergies  No prescriptions prior to admission    No results found for this or any previous visit (from the past 48 hour(s)). No results found.  Review of Systems  Constitutional: Positive for malaise/fatigue. Negative for fever and chills.  HENT: Negative.   Eyes: Negative.   Respiratory: Negative.   Cardiovascular: Positive for leg swelling. Negative for chest pain and palpitations.  Gastrointestinal: Negative.   Genitourinary: Negative.   Musculoskeletal: Positive for  joint pain (right ankle sore).  Skin:       Increasing redness of right medial ankle and leg with pain  Neurological: Negative.   Endo/Heme/Allergies: Negative.   Psychiatric/Behavioral: Negative.     There were no vitals taken for this visit. Physical Exam  Constitutional: He is oriented to person, place, and time. He appears well-developed and well-nourished. No distress.  HENT:  Head: Normocephalic and atraumatic.  Eyes: Conjunctivae and EOM are normal. Pupils are equal, round, and reactive to light.  Neck: Normal range of motion. Neck supple.  Cardiovascular: Normal rate and regular rhythm.   Respiratory: Effort normal and breath sounds normal.  GI: Soft. Bowel sounds are normal.  Genitourinary:       Deferred   Musculoskeletal: Normal range of motion.  Neurological: He is alert and oriented to person, place, and time.  Skin: There is erythema (Large area on medial right ankle/lower leg).       Medial right leg just below malleolus has a puncture site (unsure if insect bite or puncture from another source) with circumferential erythema spreading.   There are also smaller openings in the skin near the center point.  The calf is non tender, there are no bullae or blisters.  The area has extended past the original marked areas over night.  Psychiatric: He has a normal mood and affect. His behavior is normal.     Assessment/Plan 1.  Right Leg cellulitis: will admit the  patient and start on IV antibiotics with coverage for gram + and gram negative.  Also will get a CT scan of his foot and ankle tonight to see if there is any gas present in the skin.  If there is significant improvement overnight then he may be able to go home late tomorrow.    Ricardo Robbins 12/27/2011, 4:26 PM

## 2011-12-27 NOTE — Progress Notes (Addendum)
ANTIBIOTIC CONSULT NOTE - INITIAL  Pharmacy Consult for Vancomycin and Elita Quick Indication: Right leg cellulitis  No Known Allergies  Patient Measurements:  Weight- 97.5 kg from 11/06/11 Height - 188 cm from 11/06/11  Labs: No results found for this basename: WBC:3,HGB:3,PLT:3,LABCREA:3,CREATININE:3 in the last 72 hours The CrCl is unknown because both a height and weight (above a minimum accepted value) are required for this calculation. No results found for this basename: VANCOTROUGH:2,VANCOPEAK:2,VANCORANDOM:2,GENTTROUGH:2,GENTPEAK:2,GENTRANDOM:2,TOBRATROUGH:2,TOBRAPEAK:2,TOBRARND:2,AMIKACINPEAK:2,AMIKACINTROU:2,AMIKACIN:2, in the last 72 hours   Microbiology: No results found for this or any previous visit (from the past 720 hour(s)).  Medical History: Past Medical History  Diagnosis Date  . ADHD (attention deficit hyperactivity disorder)   . Bipolar 1 disorder   . OCD (obsessive compulsive disorder)     Medications:  Anti-infectives    None     Assessment: 69 YOM admitted 12/27/11 with right leg cellulitis to start Vancomycin and Fortaz per pharmacy dosing. Did receive doxycycline as outpatient but erythema spread. Last Scr available from 11/07/11 was 1.  Repeat BMET pending.   Goal of Therapy:  Vancomycin trough level 10-15 mcg/ml  Plan:  1. Vancomycin 1g IV now, then follow-up SCr to determine scheduled dosing.  2. Fortax 1g IV now, then follow-up SCr to determine scheduled dosing.   Fayne Norrie 12/27/2011,6:03 PM  ADDENDUM:  SCr 1.09, est CrCl >100 mL/min.  Plan: Will schedule Vancomycin at 1g IV q8h- will check a trough at steady state. Will schedule Fortaz at 1g IV q8h. Will continue to follow renal function and any culture data.   Link Snuffer, PharmD, BCPS Clinical Pharmacist [phone] 12/27/2011, 6:54 PM

## 2011-12-28 MED ORDER — VENLAFAXINE HCL ER 150 MG PO CP24: 150.0000 mg | ORAL_CAPSULE | Freq: Every day | ORAL | Status: AC

## 2011-12-28 MED ORDER — DOXYCYCLINE HYCLATE 50 MG PO CAPS: 100.0000 mg | ORAL_CAPSULE | Freq: Two times a day (BID) | ORAL | Status: AC

## 2011-12-28 MED ORDER — LAMOTRIGINE ER 100 MG PO TB24: 1.0000 | ORAL_TABLET | Freq: Every day | ORAL | Status: DC

## 2011-12-28 NOTE — Progress Notes (Signed)
Utilization review complete 

## 2011-12-28 NOTE — H&P (Signed)
Cellulitis that is expanding.   Admit for IV abx and workup to exclude necrotizing faciitis.  Pt seen and agree with PA note.

## 2011-12-28 NOTE — Discharge Summary (Signed)
Improving.  OK for discharge.

## 2011-12-28 NOTE — Discharge Summary (Signed)
Physician Discharge Summary  Patient ID: Ricardo Robbins MRN: [id number] DOB/AGE: 1966-03-30 46 y.o.  Admit date: 12/27/2011 Discharge date: 12/28/2011  Admitting Diagnosis: Right leg cellulitis  Discharge Diagnosis Right leg cellulitis  Consultants None  Procedures None  Hospital Course: 46 yr old male who was admitted after suffering a bug bite or other puncture wound to the medial right ankle after working in the yard.  He was treated as an outpatient with doxycycline but it did not improve and rapidly worsened.  He was admitted for IV antibiotics and underwent a CT evaluation to ensure there was no deeper involvement.  His CT showed diffuse but mild cellulitis of the medial ankle and foot.  He received several IV doses of vancomycin and fortaz and he had significant improvement in his foot with regression of the erythema.  It was felt he could be discharged home on PO abx.  He will follow up as needed.    Medication List  As of 12/28/2011  8:58 AM   TAKE these medications         ALPRAZolam 1 MG tablet   Commonly known as: XANAX   Take 1 mg by mouth 3 (three) times daily as needed. For anxiety      amphetamine-dextroamphetamine 20 MG tablet   Commonly known as: ADDERALL   Take 20 mg by mouth 3 (three) times daily.      clonazePAM 0.5 MG tablet   Commonly known as: KLONOPIN   Take 0.5 mg by mouth 3 (three) times daily as needed. For anxiety      doxycycline 50 MG capsule   Commonly known as: VIBRAMYCIN   Take 2 capsules (100 mg total) by mouth 2 (two) times daily.      LamoTRIgine 100 MG Tb24   Take 1 tablet (100 mg total) by mouth daily.      venlafaxine XR 150 MG 24 hr capsule   Commonly known as: EFFEXOR-XR   Take 150 mg by mouth daily.             Follow-up Information    Follow up with CCS,MD, MD. (As needed)    Contact information:   [address] [place] [address] [phone]          Signed: Denny Levy [facility] [phone]  12/28/2011, 8:58 AM

## 2011-12-28 NOTE — Discharge Instructions (Signed)
Use warm compresses 2-3 times daily for 10-15 mins.  Protect skin from the heat with a wash cloth or towel.  Keep leg elevated as needed for swelling.  Finish course of antibiotics.  Call for fever greater then 101, increasing redness or pain, open draining, or other questions or concerns.  Follow up as needed.

## 2011-12-28 NOTE — Progress Notes (Signed)
Patient discharged to home in care of family. Medications and instructions reviewed with patient with no questions. IV d/c'd with cath intact. Assessment unchanged from this am. Patient is to follow up as needed.

## 2012-01-11 ENCOUNTER — Telehealth (INDEPENDENT_AMBULATORY_CARE_PROVIDER_SITE_OTHER): Payer: Self-pay | Admitting: General Surgery

## 2012-01-11 NOTE — Telephone Encounter (Signed)
Pharmacist from [facility] [address] called to verify if our PA Clance Boll meant for this patient to have the regular Lamotrigine 100mg  since the patient previously has always been on Lamotrigine ER 100mg .  I paged Tresa Endo and asked her if it was okay for the patient to go back on the Lamotrigine ER and she said this was fine.  The pharmacist said she would put the patient back on his normal Rx.

## 2012-02-22 ENCOUNTER — Encounter (INDEPENDENT_AMBULATORY_CARE_PROVIDER_SITE_OTHER): Payer: Self-pay

## 2012-02-22 ENCOUNTER — Ambulatory Visit (INDEPENDENT_AMBULATORY_CARE_PROVIDER_SITE_OTHER): Payer: Managed Care, Other (non HMO) | Admitting: Internal Medicine

## 2012-02-22 VITALS — BP 134/68 | HR 72 | Temp 97.6°F | Resp 16 | Ht 74.0 in | Wt 229.2 lb

## 2012-02-22 DIAGNOSIS — L03119 Cellulitis of unspecified part of limb: Secondary | ICD-10-CM

## 2012-02-22 DIAGNOSIS — L02419 Cutaneous abscess of limb, unspecified: Secondary | ICD-10-CM

## 2012-02-22 MED ORDER — AMPHETAMINE-DEXTROAMPHETAMINE 20 MG PO TABS: 20.0000 mg | ORAL_TABLET | Freq: Four times a day (QID) | ORAL | Status: DC

## 2012-02-22 NOTE — Progress Notes (Signed)
Patient ID: Ricardo Robbins, male   DOB: 08/20/1965, 46 y.o.   MRN: [id number]   Subjective: Pt returns to the clinic to recheck his leg.  He has had several new wounds on his right leg since his last visit that were appearing infected.  Bactrim DS was called in and he was asked to follow up in clinic.  The areas have improved with bactrim and are healing well.  He does relate that he is currently needing a refill on one of his regular medicines but will be unable to see his doctor for 2 weeks.  He reports this medicine was lost on a recent business trip.  This is a scheduled medicine that the patient has been on for several years.  He has an appointment to see his doctor and plans to keep this appointment.  Objective: Vital signs in last 24 hours: Reviewed.  PE: Heart: RRR Lungs: CTA bilateral Abd: soft, nontender, +bs Ext: right leg with 3 small wounds on the anterior skin that are very superficial, no surrounding erythema or drainage, healing well now  Lab Results:  No results found for this basename: WBC:2,HGB:2,HCT:2,PLT:2 in the last 72 hours BMET No results found for this basename: NA:2,K:2,CL:2,CO2:2,GLUCOSE:2,BUN:2,CREATININE:2,CALCIUM:2 in the last 72 hours PT/INR No results found for this basename: LABPROT:2,INR:2 in the last 72 hours CMP     Component Value Date/Time   NA 140 12/27/2011 1805   K 4.7 12/27/2011 1805   CL 102 12/27/2011 1805   CO2 30 12/27/2011 1805   GLUCOSE 94 12/27/2011 1805   BUN 23 12/27/2011 1805   CREATININE 1.09 12/27/2011 1805   CALCIUM 9.2 12/27/2011 1805   GFRNONAA 80* 12/27/2011 1805   GFRAA >90 12/27/2011 1805   Lipase  No results found for this basename: lipase       Studies/Results: No results found.  Anti-infectives: Anti-infectives    None       Assessment/Plan  1.  RIght leg wounds with recent cellulitis: now resolving and healing well, finish bactrim.  Leave open to air.  Can follow up PRN for this.  In regards to his refill  request, I will fill this and will have him follow up with his regular doctor as scheduled.    WHITE, ELIZABETH 02/22/2012

## 2012-05-23 ENCOUNTER — Telehealth (INDEPENDENT_AMBULATORY_CARE_PROVIDER_SITE_OTHER): Payer: Self-pay | Admitting: General Surgery

## 2012-05-23 NOTE — Telephone Encounter (Signed)
Pt called requesting Ambien. He is a patient I have seen for leg cellulitis. He does not have a primary MD and is requesting something for sleep. I told him that I would call this in but he will need to establish care as soon as possible with a primary care md for continued care. I need to document this in his chart but I didn't know how to do this. He needs [place] called in to the [facility] at the [address] in [place]. Ambien 5mg  1-2 po qhs prn insomnia Disp:30, no refills. Thanks Marisue Ivan  This has been called in 11:02 Tuesday 05/23/12 June Leap M.A.

## 2012-05-23 NOTE — Telephone Encounter (Signed)
Message copied by June Leap on Tue May 23, 2012 10:58 AM ------      Message from: Doristine Mango A      Created: Tue May 23, 2012  9:13 AM       Pt called requesting Ambien.  He is a patient I have seen for leg cellulitis.  He does not have a primary MD and is requesting something for sleep.  I told him that I would call this in but he will need to establish care as soon as possible with a primary care md for continued care.  I need to document this in his chart but I didn't know how to do this.  He needs the [place] called in to the [facility] at the [address] in [place].              Ambien 5mg  1-2 po qhs prn insomnia      Disp:30, no refills.            Thanks      Marisue Ivan

## 2012-05-30 ENCOUNTER — Telehealth (INDEPENDENT_AMBULATORY_CARE_PROVIDER_SITE_OTHER): Payer: Self-pay | Admitting: Internal Medicine

## 2012-05-30 ENCOUNTER — Encounter (INDEPENDENT_AMBULATORY_CARE_PROVIDER_SITE_OTHER): Payer: Self-pay | Admitting: Internal Medicine

## 2012-05-30 NOTE — Telephone Encounter (Signed)
Patient called requesting refill of Adderall.  He has been unable to get an appointment with his psychiatrist.  I attempted to contact Dr. Evelene Croon and left a message to call me back but I did not receive a return call from Dr. Evelene Croon.  I will attempt to contact Dr. Evelene Croon after Christmas as well but will refill Adderall today.  He will need to try to contact Dr. Evelene Croon again as soon as possible as well.  The patient travels frequently for work and has difficulty arrange appointments with his MD.

## 2012-06-02 ENCOUNTER — Observation Stay (HOSPITAL_COMMUNITY)
Admission: EM | Admit: 2012-06-02 | Discharge: 2012-06-05 | Disposition: A | Discharge status: Home / Self Care | Payer: Self-pay | Attending: Surgery | Admitting: Surgery

## 2012-06-02 ENCOUNTER — Emergency Department (HOSPITAL_COMMUNITY): Payer: Self-pay

## 2012-06-02 ENCOUNTER — Encounter (HOSPITAL_COMMUNITY): Payer: Self-pay | Admitting: Emergency Medicine

## 2012-06-02 DIAGNOSIS — R197 Diarrhea, unspecified: Secondary | ICD-10-CM | POA: Insufficient documentation

## 2012-06-02 DIAGNOSIS — F319 Bipolar disorder, unspecified: Secondary | ICD-10-CM | POA: Diagnosis present

## 2012-06-02 DIAGNOSIS — K2970 Gastritis, unspecified, without bleeding: Secondary | ICD-10-CM | POA: Insufficient documentation

## 2012-06-02 DIAGNOSIS — K2990 Gastroduodenitis, unspecified, without bleeding: Secondary | ICD-10-CM | POA: Insufficient documentation

## 2012-06-02 DIAGNOSIS — R1110 Vomiting, unspecified: Secondary | ICD-10-CM | POA: Insufficient documentation

## 2012-06-02 DIAGNOSIS — K922 Gastrointestinal hemorrhage, unspecified: Secondary | ICD-10-CM | POA: Diagnosis present

## 2012-06-02 DIAGNOSIS — R1013 Epigastric pain: Secondary | ICD-10-CM | POA: Insufficient documentation

## 2012-06-02 DIAGNOSIS — R109 Unspecified abdominal pain: Secondary | ICD-10-CM

## 2012-06-02 DIAGNOSIS — F909 Attention-deficit hyperactivity disorder, unspecified type: Secondary | ICD-10-CM | POA: Diagnosis present

## 2012-06-02 DIAGNOSIS — F429 Obsessive-compulsive disorder, unspecified: Secondary | ICD-10-CM | POA: Diagnosis present

## 2012-06-02 DIAGNOSIS — K264 Chronic or unspecified duodenal ulcer with hemorrhage: Principal | ICD-10-CM | POA: Insufficient documentation

## 2012-06-02 DIAGNOSIS — K259 Gastric ulcer, unspecified as acute or chronic, without hemorrhage or perforation: Secondary | ICD-10-CM | POA: Insufficient documentation

## 2012-06-02 HISTORY — DX: Other complications of anesthesia, initial encounter: T88.59XA

## 2012-06-02 HISTORY — DX: Adverse effect of unspecified anesthetic, initial encounter: T41.45XA

## 2012-06-02 LAB — URINALYSIS, ROUTINE W REFLEX MICROSCOPIC
Glucose, UA: NEGATIVE mg/dL
Ketones, ur: 15 mg/dL — AB
Leukocytes, UA: NEGATIVE
Protein, ur: NEGATIVE mg/dL
pH: 5.5 (ref 5.0–8.0)

## 2012-06-02 LAB — CBC WITH DIFFERENTIAL/PLATELET
Basophils Relative: 0 % (ref 0–1)
Eosinophils Absolute: 0.1 10*3/uL (ref 0.0–0.7)
Eosinophils Relative: 0 % (ref 0–5)
HCT: 36.6 % — ABNORMAL LOW (ref 39.0–52.0)
Hemoglobin: 13 g/dL (ref 13.0–17.0)
Lymphs Abs: 2.5 10*3/uL (ref 0.7–4.0)
MCH: 30.7 pg (ref 26.0–34.0)
MCHC: 35.5 g/dL (ref 30.0–36.0)
MCV: 86.5 fL (ref 78.0–100.0)
Monocytes Absolute: 1.1 10*3/uL — ABNORMAL HIGH (ref 0.1–1.0)
Monocytes Relative: 10 % (ref 3–12)
RBC: 4.23 MIL/uL (ref 4.22–5.81)

## 2012-06-02 LAB — COMPREHENSIVE METABOLIC PANEL
Albumin: 3.7 g/dL (ref 3.5–5.2)
Alkaline Phosphatase: 44 U/L (ref 39–117)
BUN: 33 mg/dL — ABNORMAL HIGH (ref 6–23)
Calcium: 9.2 mg/dL (ref 8.4–10.5)
Creatinine, Ser: 1.19 mg/dL (ref 0.50–1.35)
GFR calc Af Amer: 83 mL/min — ABNORMAL LOW (ref >90)
Glucose, Bld: 119 mg/dL — ABNORMAL HIGH (ref 70–99)
Potassium: 4.1 mEq/L (ref 3.5–5.1)
Total Protein: 6.5 g/dL (ref 6.0–8.3)

## 2012-06-02 MED ORDER — MORPHINE SULFATE 4 MG/ML IJ SOLN
INTRAMUSCULAR | Status: AC
  Filled 2012-06-02: qty 1

## 2012-06-02 MED ORDER — HYDROMORPHONE HCL PF 1 MG/ML IJ SOLN
1.0000 mg | INTRAMUSCULAR | Status: DC | PRN
  Administered 2012-06-02 – 2012-06-05 (×10): 1 mg via INTRAVENOUS
  Filled 2012-06-02 (×9): qty 1

## 2012-06-02 MED ORDER — SODIUM CHLORIDE 0.9 % IV SOLN
INTRAVENOUS | Status: DC
  Administered 2012-06-02 – 2012-06-05 (×3): via INTRAVENOUS

## 2012-06-02 MED ORDER — SODIUM CHLORIDE 0.9 % IV BOLUS (SEPSIS)
1000.0000 mL | Freq: Once | INTRAVENOUS | Status: AC
  Administered 2012-06-02: 1000 mL via INTRAVENOUS

## 2012-06-02 MED ORDER — IOHEXOL 300 MG/ML  SOLN
20.0000 mL | INTRAMUSCULAR | Status: AC
  Administered 2012-06-02: 20 mL via ORAL

## 2012-06-02 MED ORDER — ACETAMINOPHEN 10 MG/ML IV SOLN
1000.0000 mg | Freq: Once | INTRAVENOUS | Status: DC
  Filled 2012-06-02: qty 100

## 2012-06-02 MED ORDER — ONDANSETRON HCL 4 MG/2ML IJ SOLN
4.0000 mg | Freq: Four times a day (QID) | INTRAMUSCULAR | Status: DC | PRN
  Administered 2012-06-02: 4 mg via INTRAVENOUS

## 2012-06-02 MED ORDER — MORPHINE SULFATE 4 MG/ML IJ SOLN
4.0000 mg | INTRAMUSCULAR | Status: DC | PRN
  Administered 2012-06-02 – 2012-06-03 (×5): 4 mg via INTRAVENOUS
  Filled 2012-06-02 (×4): qty 1

## 2012-06-02 MED ORDER — ACETAMINOPHEN 10 MG/ML IV SOLN
1000.0000 mg | Freq: Four times a day (QID) | INTRAVENOUS | Status: DC
  Administered 2012-06-02: 1000 mg via INTRAVENOUS

## 2012-06-02 MED ORDER — ONDANSETRON HCL 4 MG/2ML IJ SOLN
INTRAMUSCULAR | Status: AC
  Administered 2012-06-02: 4 mg via INTRAVENOUS
  Filled 2012-06-02: qty 2

## 2012-06-02 MED ORDER — HYDROMORPHONE HCL PF 1 MG/ML IJ SOLN
INTRAMUSCULAR | Status: AC
  Filled 2012-06-02: qty 1

## 2012-06-02 NOTE — ED Notes (Signed)
PT. REPORTS DIFFUSED GENERALIZED ABDOMINAL PAIN WITH VOMITTING AND BLOODY DIARRHEA / CHILLS ONSET YESTERDAY .
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
## 2012-06-03 ENCOUNTER — Observation Stay (HOSPITAL_COMMUNITY): Payer: Self-pay

## 2012-06-03 ENCOUNTER — Encounter (HOSPITAL_COMMUNITY)
Admission: EM | Disposition: A | Discharge status: Home / Self Care | Payer: Self-pay | Source: Home / Self Care | Attending: Surgery

## 2012-06-03 ENCOUNTER — Encounter (HOSPITAL_COMMUNITY): Payer: Self-pay

## 2012-06-03 DIAGNOSIS — K922 Gastrointestinal hemorrhage, unspecified: Secondary | ICD-10-CM | POA: Diagnosis present

## 2012-06-03 HISTORY — PX: ESOPHAGOGASTRODUODENOSCOPY: SHX5428

## 2012-06-03 LAB — BASIC METABOLIC PANEL
CO2: 23 mEq/L (ref 19–32)
Calcium: 8.1 mg/dL — ABNORMAL LOW (ref 8.4–10.5)
Glucose, Bld: 90 mg/dL (ref 70–99)
Sodium: 138 mEq/L (ref 135–145)

## 2012-06-03 LAB — TYPE AND SCREEN: Antibody Screen: NEGATIVE

## 2012-06-03 LAB — CBC
HCT: 27.1 % — ABNORMAL LOW (ref 39.0–52.0)
Hemoglobin: 9.4 g/dL — ABNORMAL LOW (ref 13.0–17.0)
MCH: 30.2 pg (ref 26.0–34.0)
RBC: 3.11 MIL/uL — ABNORMAL LOW (ref 4.22–5.81)

## 2012-06-03 SURGERY — EGD (ESOPHAGOGASTRODUODENOSCOPY)
Anesthesia: Moderate Sedation

## 2012-06-03 MED ORDER — VENLAFAXINE HCL ER 150 MG PO CP24
150.0000 mg | ORAL_CAPSULE | Freq: Every day | ORAL | Status: DC
  Administered 2012-06-03 – 2012-06-05 (×3): 150 mg via ORAL
  Filled 2012-06-03 (×6): qty 1

## 2012-06-03 MED ORDER — ALPRAZOLAM 0.5 MG PO TABS
1.0000 mg | ORAL_TABLET | Freq: Three times a day (TID) | ORAL | Status: DC | PRN
  Administered 2012-06-05 (×2): 1 mg via ORAL
  Filled 2012-06-03 (×2): qty 2

## 2012-06-03 MED ORDER — MIDAZOLAM HCL 10 MG/2ML IJ SOLN
INTRAMUSCULAR | Status: DC | PRN
  Administered 2012-06-03 (×2): 2 mg via INTRAVENOUS
  Administered 2012-06-03: 1 mg via INTRAVENOUS
  Administered 2012-06-03 (×2): 2 mg via INTRAVENOUS

## 2012-06-03 MED ORDER — LAMOTRIGINE ER 100 MG PO TB24: 1.0000 | ORAL_TABLET | Freq: Every day | ORAL | Status: DC

## 2012-06-03 MED ORDER — IOHEXOL 300 MG/ML  SOLN
100.0000 mL | Freq: Once | INTRAMUSCULAR | Status: AC | PRN
  Administered 2012-06-03: 100 mL via INTRAVENOUS

## 2012-06-03 MED ORDER — SODIUM CHLORIDE 0.9 % IV SOLN
INTRAVENOUS | Status: DC
  Administered 2012-06-03 (×2): via INTRAVENOUS

## 2012-06-03 MED ORDER — FENTANYL CITRATE 0.05 MG/ML IJ SOLN
INTRAMUSCULAR | Status: DC | PRN
  Administered 2012-06-03 (×3): 25 ug via INTRAVENOUS

## 2012-06-03 MED ORDER — DIPHENHYDRAMINE HCL 50 MG/ML IJ SOLN
12.5000 mg | Freq: Once | INTRAMUSCULAR | Status: AC
  Administered 2012-06-03: 12.5 mg via INTRAVENOUS
  Filled 2012-06-03: qty 1

## 2012-06-03 MED ORDER — AMPHETAMINE-DEXTROAMPHETAMINE 10 MG PO TABS
20.0000 mg | ORAL_TABLET | Freq: Four times a day (QID) | ORAL | Status: DC
  Administered 2012-06-03 – 2012-06-05 (×10): 20 mg via ORAL
  Filled 2012-06-03: qty 1
  Filled 2012-06-03 (×2): qty 2
  Filled 2012-06-03: qty 1
  Filled 2012-06-03: qty 2
  Filled 2012-06-03: qty 1
  Filled 2012-06-03 (×2): qty 2
  Filled 2012-06-03: qty 1
  Filled 2012-06-03 (×3): qty 2

## 2012-06-03 MED ORDER — SODIUM CHLORIDE 0.9 % IV SOLN
8.0000 mg/h | INTRAVENOUS | Status: DC
  Administered 2012-06-03: 8 mg/h via INTRAVENOUS
  Filled 2012-06-03 (×4): qty 80

## 2012-06-03 MED ORDER — LAMOTRIGINE 25 MG PO TABS
50.0000 mg | ORAL_TABLET | Freq: Two times a day (BID) | ORAL | Status: DC
  Administered 2012-06-03 – 2012-06-05 (×5): 50 mg via ORAL
  Filled 2012-06-03 (×6): qty 2

## 2012-06-03 MED ORDER — ACETAMINOPHEN 10 MG/ML IV SOLN
1000.0000 mg | Freq: Four times a day (QID) | INTRAVENOUS | Status: AC
  Administered 2012-06-03 – 2012-06-04 (×4): 1000 mg via INTRAVENOUS
  Filled 2012-06-03 (×4): qty 100

## 2012-06-03 MED ORDER — DIPHENHYDRAMINE HCL 50 MG/ML IJ SOLN
INTRAMUSCULAR | Status: DC | PRN
  Administered 2012-06-03: 25 mg via INTRAVENOUS

## 2012-06-03 MED ORDER — FENTANYL CITRATE 0.05 MG/ML IJ SOLN
INTRAMUSCULAR | Status: AC
  Filled 2012-06-03: qty 2

## 2012-06-03 MED ORDER — PANTOPRAZOLE SODIUM 40 MG IV SOLR
40.0000 mg | Freq: Every day | INTRAVENOUS | Status: DC
  Filled 2012-06-03: qty 40

## 2012-06-03 MED ORDER — MIDAZOLAM HCL 5 MG/ML IJ SOLN
INTRAMUSCULAR | Status: AC
  Filled 2012-06-03: qty 3

## 2012-06-03 MED ORDER — ZOLPIDEM TARTRATE 5 MG PO TABS
10.0000 mg | ORAL_TABLET | Freq: Every evening | ORAL | Status: DC | PRN
  Administered 2012-06-05: 5 mg via ORAL
  Filled 2012-06-03: qty 1

## 2012-06-03 MED ORDER — PROMETHAZINE HCL 25 MG/ML IJ SOLN
12.5000 mg | INTRAMUSCULAR | Status: DC | PRN
  Filled 2012-06-03: qty 1

## 2012-06-03 MED ORDER — SODIUM CHLORIDE 0.9 % IV SOLN
INTRAVENOUS | Status: DC
  Administered 2012-06-03: 20 mL/h via INTRAVENOUS

## 2012-06-03 MED ORDER — BUTAMBEN-TETRACAINE-BENZOCAINE 2-2-14 % EX AERO
INHALATION_SPRAY | CUTANEOUS | Status: DC | PRN
  Administered 2012-06-03: 2 via TOPICAL

## 2012-06-03 MED ORDER — AMPHETAMINE-DEXTROAMPHETAMINE 20 MG PO TABS
20.0000 mg | ORAL_TABLET | Freq: Four times a day (QID) | ORAL | Status: DC
Start: 2012-06-03 — End: 2012-06-03

## 2012-06-03 MED ORDER — PANTOPRAZOLE SODIUM 40 MG IV SOLR
40.0000 mg | Freq: Two times a day (BID) | INTRAVENOUS | Status: DC
  Administered 2012-06-03: 40 mg via INTRAVENOUS
  Filled 2012-06-03 (×2): qty 40

## 2012-06-03 NOTE — Brief Op Note (Signed)
See endopro for details. Duodenal bulb ulcer with bleeding stigmata. Will change to Protonix infusion. Clears. Follow H/Hs.

## 2012-06-03 NOTE — Progress Notes (Signed)
Doing well after EGD.  Duodenal bulb ulcer with stigmata of bleeding found.  He has allot of anxiety will leave some xanax for him to use PRN.

## 2012-06-03 NOTE — Progress Notes (Signed)
Subjective: Still complaining of epigastric pain and pain with BM.  Objective: Vital signs in last 24 hours: Temp:  [97.7 F (36.5 C)-99.2 F (37.3 C)] 98.6 F (37 C) (12/28 0629) Pulse Rate:  [56-69] 56  (12/28 0629) Resp:  [18-22] 18  (12/28 0629) BP: (116-130)/(54-72) 116/59 mmHg (12/28 0629) SpO2:  [97 %-100 %] 100 % (12/28 0629) Weight:  [226 lb 13.7 oz (102.9 kg)] 226 lb 13.7 oz (102.9 kg) (12/28 0226) Last BM Date: 06/03/12  Diet: clears, TM 99.3, labs are pending  Intake/Output from previous day: 12/27 0701 - 12/28 0700 In: 240 [P.O.:240] Out: 450 [Urine:450] Intake/Output this shift:    General appearance: alert, cooperative and anxious Resp: clear to auscultation bilaterally GI: soft, a bit tender over epigastric area.  +BS,  no distension. Rectal exam shows normal rectum, stool is maroon colored and grossly bloody.  Lab Results:   [facility] 06/02/12 2014  WBC 11.9*  HGB 13.0  HCT 36.6*  PLT 283    BMET  Basename 06/02/12 2014  NA 140  K 4.1  CL 103  CO2 23  GLUCOSE 119*  BUN 33*  CREATININE 1.19  CALCIUM 9.2   PT/INR No results found for this basename: LABPROT:2,INR:2 in the last 72 hours   Lab 06/02/12 2014  AST 19  ALT 19  ALKPHOS 44  BILITOT 0.6  PROT 6.5  ALBUMIN 3.7     Lipase     Component Value Date/Time   LIPASE 19 06/02/2012 2014     Studies/Results: Ct Abdomen Pelvis W Contrast  06/03/2012  *RADIOLOGY REPORT*  Clinical Data: Generalized abdominal pain, nausea and bloody diarrhea.  CT ABDOMEN AND PELVIS WITH CONTRAST  Technique:  Multidetector CT imaging of the abdomen and pelvis was performed following the standard protocol during bolus administration of intravenous contrast.  Contrast: OMNIPAQUE IOHEXOL 300 MG/ML  SOLN  Comparison: Abdominal radiograph performed 06/02/2012  Findings: The visualized lung bases are clear.  Scattered hypodensities are noted within the liver, measuring up to 1.6 cm.  These are  nonspecific.  The largest of these, seen near the hepatic hilum at the left hepatic lobe, is slightly heterogeneous; would correlate with LFTs and consider dynamic liver protocol MRI or CT for further evaluation.  The liver is otherwise unremarkable in appearance.  The spleen is within normal limits.  The gallbladder is unremarkable.  The pancreas is grossly unremarkable in appearance.  The adrenal glands are within normal limits.  The kidneys are unremarkable in appearance.  There is no evidence of hydronephrosis.  No renal or ureteral stones are seen.  No perinephric stranding is appreciated.  There is question of mild stranding and haziness about the second segment of the duodenum, though this may be artifactual in nature; would correlate for symptoms of mild duodenitis.  The remaining small bowel is unremarkable in appearance.  No free fluid is identified.  The stomach is within normal limits. No acute vascular abnormalities are seen.  The appendix is normal in caliber and contains air and contrast, without evidence for appendicitis.  Contrast progresses to the proximal sigmoid colon.  The colon is unremarkable in appearance.  The bladder is mildly distended and grossly unremarkable.  The prostate remains normal in size.  A small right inguinal hernia is seen, containing only fat.  No inguinal lymphadenopathy is seen.  No acute osseous abnormalities are identified.  IMPRESSION:  1.  Question of mild stranding and haziness about the second segment of the duodenum, though this may  be artifactual in nature. Would correlate for symptoms of mild duodenitis, and follow-up with labs to exclude very mild pancreatitis. 2.  Small right inguinal hernia, containing only fat. 3.  Scattered hypodensities within the liver, measuring up to 1.6 cm in size.  The largest of these, seen near the hepatic hilum at the left hepatic lobe, is slightly heterogeneous; would correlate with LFTs and consider dynamic liver protocol MRI or CT  for further evaluation, on an elective non-emergent basis.   Original Report Authenticated By: Tonia Ghent, M.D.    Dg Abd Acute W/chest  06/02/2012  *RADIOLOGY REPORT*  Clinical Data: Acute abdominal pain  ACUTE ABDOMEN SERIES (ABDOMEN 2 VIEW & CHEST 1 VIEW)  Comparison: 05/03/2009  Findings: Normal heart size.  No pleural effusion or edema.  No airspace consolidation identified.  Bowel gas pattern appears nonspecific.  There are a few air-filled loops of small bowel measuring up to 2.7 cm.  Gas and stool noted within normal-caliber colon.  No air-fluid levels identified.  IMPRESSION:  1.  No active cardiopulmonary abnormalities. 2.   Nonspecific bowel gas pattern   Original Report Authenticated By: Signa Kell, M.D.     Medications:    . amphetamine-dextroamphetamine  20 mg Oral QID  . lamoTRIgine  50 mg Oral BID  . pantoprazole (PROTONIX) IV  40 mg Intravenous QHS  . venlafaxine XR  150 mg Oral Q breakfast    Assessment/Plan   Abdominal pain Probable GI bleed ADHD Bipolar OCD LUE   Body mass index is 29.  PLAN:  I will ask Gi to see this AM labs are pending also, along with type and screen.   No prior GI exams, I have called DR. Schooler. Increased IV protonix to BID.  labs reordered for AM.   LOS: 1 day    Garnetta Fedrick 06/03/2012

## 2012-06-03 NOTE — ED Notes (Signed)
PT back from CT.  Denies pain at this time.  No emesis/diarrhea up to this time.

## 2012-06-03 NOTE — ED Notes (Signed)
PT c/o increasing abd burning while drinking contrast.  Denies nausea at this time.  No hives noted to R forearm.

## 2012-06-03 NOTE — Interval H&P Note (Signed)
History and Physical Interval Note:  06/03/2012 1:02 PM  Ricardo Robbins  has presented today for surgery, with the diagnosis of GI Bleed  The various methods of treatment have been discussed with the patient and family. After consideration of risks, benefits and other options for treatment, the patient has consented to  Procedure(s) (LRB) with comments: ESOPHAGOGASTRODUODENOSCOPY (EGD) (N/A) as a surgical intervention .  The patient's history has been reviewed, patient examined, no change in status, stable for surgery.  I have reviewed the patient's chart and labs.  Questions were answered to the patient's satisfaction.     Kenyonna Micek C.

## 2012-06-03 NOTE — ED Notes (Signed)
PT requested meds for nausea.  5 minutes after ivp zofran pt broke out in hives around IV site.  Dr Magnus Ivan paged and stated to cancel zofran order, place order for phenergan and give benadryl.  Pt airway unaffected at this time.  Pt is on monitor and told to notify staff if he notices s/s of throat swelling, difficulty breathing, etc.

## 2012-06-03 NOTE — Op Note (Signed)
[facility] [address] [place], [address]   ENDOSCOPY PROCEDURE REPORT  PATIENT: Ricardo Robbins, Ricardo Robbins  MR#: [id number] BIRTHDATE: Nov 15, 1965 , 46  yrs. old GENDER: Male  ENDOSCOPIST: Charlott Rakes, MD REFERRED [id number] Jamey Ripa, M.D.  PROCEDURE DATE:  06/03/2012 PROCEDURE:   EGD w/ biopsy ASA CLASS:   Class II INDICATIONS:Melena.   Hematochezia. MEDICATIONS: Fentanyl 75 mcg IV, Versed 9 mg IV, Diphenhydramine (Benadryl) 25 mg IV, and Cetacaine spray x 2  TOPICAL ANESTHETIC:  DESCRIPTION OF PROCEDURE:   After the risks benefits and alternatives of the procedure were thoroughly explained, informed consent was obtained.  The     endoscope was introduced through the mouth and advanced to the second portion of the duodenum , limited by Without limitations.   The instrument was slowly withdrawn as the mucosa was fully examined.     FINDINGS: The endoscope was inserted into the oropharynx and esophagus was intubated.  The gastroesophageal junction was noted to be 40 cm from the incisors.  Endoscope was advanced into the stomach, which revealed erythema in the distal stomach in a longitudinal pattern with superficial ulcerations.   The endoscope was advanced to the duodenal bulb and in the distal duodenal bulb at the genu was a medium-sized ulcer with dark pigmented material consistent with recent bleeding. No visible vessel or active bleeding was seen. Surrounding mucosa was edematous and erythematous. The second portion of duodenum was unremarkable.  The endoscope was withdrawn back into the stomach and biopsies were taken of the distal stomach to check for histology. Retroflexion revealed a normal proximal stomach.  COMPLICATIONS: None  ENDOSCOPIC IMPRESSION:     1. Duodenal bulb ulcer with bleeding stigmata (without active bleeding)- likely NSAID-induced 2. Antral gastritis with superficial gastric ulcerations   RECOMMENDATIONS: Start  Protonix infusion; Avoid all NSAIDs; F/U on path for H. pylori and treat if positive; Clears; Follow H/Hs   REPEAT EXAM: N/A  _______________________________ Charlott Rakes, MD eSigned:  Charlott Rakes, MD 06/03/2012 1:36 PM    [id number] Streck, MD  PATIENT NAME:  Ricardo Robbins, Ricardo Robbins MR#: [id number]
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
## 2012-06-03 NOTE — ED Notes (Signed)
Pt transported to CT ?

## 2012-06-03 NOTE — Progress Notes (Signed)
Agree with PA Jenning's note.  Pt with less lower abd pain. GI to scope today, will f/u with their recs and findings

## 2012-06-04 LAB — CBC
HCT: 24.2 % — ABNORMAL LOW (ref 39.0–52.0)
MCV: 87.4 fL (ref 78.0–100.0)
RDW: 13 % (ref 11.5–15.5)
WBC: 5 10*3/uL (ref 4.0–10.5)

## 2012-06-04 LAB — BASIC METABOLIC PANEL
BUN: 13 mg/dL (ref 6–23)
Chloride: 110 mEq/L (ref 96–112)
Creatinine, Ser: 0.99 mg/dL (ref 0.50–1.35)
GFR calc Af Amer: >90 mL/min (ref >90)

## 2012-06-04 MED ORDER — TAB-A-VITE/IRON PO TABS
1.0000 | ORAL_TABLET | Freq: Every day | ORAL | Status: DC
  Administered 2012-06-04: 1 via ORAL
  Filled 2012-06-04 (×2): qty 1

## 2012-06-04 MED ORDER — ACETAMINOPHEN 325 MG PO TABS
650.0000 mg | ORAL_TABLET | ORAL | Status: DC | PRN
  Administered 2012-06-04 – 2012-06-05 (×2): 650 mg via ORAL
  Filled 2012-06-04 (×2): qty 2

## 2012-06-04 MED ORDER — PANTOPRAZOLE SODIUM 40 MG IV SOLR
40.0000 mg | Freq: Two times a day (BID) | INTRAVENOUS | Status: DC
  Administered 2012-06-04 – 2012-06-05 (×2): 40 mg via INTRAVENOUS
  Filled 2012-06-04 (×4): qty 40

## 2012-06-04 NOTE — Progress Notes (Signed)
1 Day Post-Op  Subjective: Abdominal pain controlled Remains hemodynamically stable  Objective: Vital signs in last 24 hours: Temp:  [97.8 F (36.6 C)-98.4 F (36.9 C)] 98.1 F (36.7 C) (12/29 0629) Pulse Rate:  [54-67] 54  (12/29 0629) Resp:  [13-65] 18  (12/29 0629) BP: (86-132)/(35-78) 124/50 mmHg (12/29 0629) SpO2:  [92 %-100 %] 92 % (12/29 0629) Last BM Date: 06/03/12  Intake/Output from previous day: 12/28 0701 - 12/29 0700 In: 480 [P.O.:240; I.V.:240] Out: -  Intake/Output this shift:    Abdomen soft, Non tender  Lab Results:   Basename 06/04/12 0520 06/03/12 0931  WBC 5.0 7.4  HGB 8.5* 9.4*  HCT 24.2* 27.1*  PLT 141* 174   BMET  Basename 06/04/12 0520 06/03/12 0931  NA 142 138  K 4.1 3.8  CL 110 106  CO2 25 23  GLUCOSE 90 90  BUN 13 22  CREATININE 0.99 0.99  CALCIUM 8.1* 8.1*   PT/INR No results found for this basename: LABPROT:2,INR:2 in the last 72 hours ABG No results found for this basename: PHART:2,PCO2:2,PO2:2,HCO3:2 in the last 72 hours  Studies/Results: Ct Abdomen Pelvis W Contrast  06/03/2012  *RADIOLOGY REPORT*  Clinical Data: Generalized abdominal pain, nausea and bloody diarrhea.  CT ABDOMEN AND PELVIS WITH CONTRAST  Technique:  Multidetector CT imaging of the abdomen and pelvis was performed following the standard protocol during bolus administration of intravenous contrast.  Contrast: OMNIPAQUE IOHEXOL 300 MG/ML  SOLN  Comparison: Abdominal radiograph performed 06/02/2012  Findings: The visualized lung bases are clear.  Scattered hypodensities are noted within the liver, measuring up to 1.6 cm.  These are nonspecific.  The largest of these, seen near the hepatic hilum at the left hepatic lobe, is slightly heterogeneous; would correlate with LFTs and consider dynamic liver protocol MRI or CT for further evaluation.  The liver is otherwise unremarkable in appearance.  The spleen is within normal limits.  The gallbladder is unremarkable.   The pancreas is grossly unremarkable in appearance.  The adrenal glands are within normal limits.  The kidneys are unremarkable in appearance.  There is no evidence of hydronephrosis.  No renal or ureteral stones are seen.  No perinephric stranding is appreciated.  There is question of mild stranding and haziness about the second segment of the duodenum, though this may be artifactual in nature; would correlate for symptoms of mild duodenitis.  The remaining small bowel is unremarkable in appearance.  No free fluid is identified.  The stomach is within normal limits. No acute vascular abnormalities are seen.  The appendix is normal in caliber and contains air and contrast, without evidence for appendicitis.  Contrast progresses to the proximal sigmoid colon.  The colon is unremarkable in appearance.  The bladder is mildly distended and grossly unremarkable.  The prostate remains normal in size.  A small right inguinal hernia is seen, containing only fat.  No inguinal lymphadenopathy is seen.  No acute osseous abnormalities are identified.  IMPRESSION:  1.  Question of mild stranding and haziness about the second segment of the duodenum, though this may be artifactual in nature. Would correlate for symptoms of mild duodenitis, and follow-up with labs to exclude very mild pancreatitis. 2.  Small right inguinal hernia, containing only fat. 3.  Scattered hypodensities within the liver, measuring up to 1.6 cm in size.  The largest of these, seen near the hepatic hilum at the left hepatic lobe, is slightly heterogeneous; would correlate with LFTs and consider dynamic liver protocol  MRI or CT for further evaluation, on an elective non-emergent basis.   Original Report Authenticated By: Tonia Ghent, M.D.    Dg Abd Acute W/chest  06/02/2012  *RADIOLOGY REPORT*  Clinical Data: Acute abdominal pain  ACUTE ABDOMEN SERIES (ABDOMEN 2 VIEW & CHEST 1 VIEW)  Comparison: 05/03/2009  Findings: Normal heart size.  No pleural  effusion or edema.  No airspace consolidation identified.  Bowel gas pattern appears nonspecific.  There are a few air-filled loops of small bowel measuring up to 2.7 cm.  Gas and stool noted within normal-caliber colon.  No air-fluid levels identified.  IMPRESSION:  1.  No active cardiopulmonary abnormalities. 2.   Nonspecific bowel gas pattern   Original Report Authenticated By: Signa Kell, M.D.     Anti-infectives: Anti-infectives    None      Assessment/Plan: s/p Procedure(s) (LRB) with comments: ESOPHAGOGASTRODUODENOSCOPY (EGD) (N/A)  GI bleed  Will advance to full liquids Follow HGB/HCT  LOS: 2 days    Oneal Biglow A 06/04/2012

## 2012-06-04 NOTE — Progress Notes (Addendum)
Patient ID: Ricardo Robbins, male   DOB: 12/11/1965, 46 y.o.   MRN: [id number] [facility] Gastroenterology Progress Note  Ricardo Robbins 46 y.o. 07-12-1965   Subjective: Feels good. Sitting in chair dressed and working on computer. Tolerating liquids and looking forward to eating.  Objective: Vital signs: Filed Vitals:   06/04/12 0629  BP: 124/50  Pulse: 54  Temp: 98.1 F (36.7 C)  Resp: 18    Physical Exam: Gen: alert, no acute distress, well-nourished   Lab Results:  [facility] 06/04/12 0520 06/03/12 0931  NA 142 138  K 4.1 3.8  CL 110 106  CO2 25 23  GLUCOSE 90 90  BUN 13 22  CREATININE 0.99 0.99  CALCIUM 8.1* 8.1*  MG -- --  PHOS -- --    Basename 06/02/12 2014  AST 19  ALT 19  ALKPHOS 44  BILITOT 0.6  PROT 6.5  ALBUMIN 3.7    Basename 06/04/12 0520 06/03/12 0931 06/02/12 2014  WBC 5.0 7.4 --  NEUTROABS -- -- 8.2*  HGB 8.5* 9.4* --  HCT 24.2* 27.1* --  MCV 87.4 87.1 --  PLT 141* 174 --      Assessment/Plan: 46yo s/p GI bleed from duodenal ulcer that was likely NSAID-induced. Will d/c Protonix infusion and change to IV PPI Q 12 hours. Ok to change to PO PPI BID tomorrow if stable. Will need PPI PO BID for 3 months and then QD. Needs to avoid all NSAIDs. Will f/u on bx as outpt. F/U with me in February. Ok to leave tomorrow from GI standpoint if continues to be stable. Will sign off. Call back if questions.   Hosey Burmester C. 06/04/2012, 11:56 AM

## 2012-06-04 NOTE — Progress Notes (Signed)
Utilization review completed.  

## 2012-06-05 ENCOUNTER — Encounter (HOSPITAL_COMMUNITY): Payer: Self-pay | Admitting: Gastroenterology

## 2012-06-05 DIAGNOSIS — F909 Attention-deficit hyperactivity disorder, unspecified type: Secondary | ICD-10-CM | POA: Diagnosis present

## 2012-06-05 DIAGNOSIS — F429 Obsessive-compulsive disorder, unspecified: Secondary | ICD-10-CM

## 2012-06-05 DIAGNOSIS — F319 Bipolar disorder, unspecified: Secondary | ICD-10-CM

## 2012-06-05 DIAGNOSIS — K260 Acute duodenal ulcer with hemorrhage: Secondary | ICD-10-CM

## 2012-06-05 HISTORY — DX: Bipolar disorder, unspecified: F31.9

## 2012-06-05 HISTORY — DX: Obsessive-compulsive disorder, unspecified: F42.9

## 2012-06-05 LAB — CBC
HCT: 24.1 % — ABNORMAL LOW (ref 39.0–52.0)
MCHC: 34.9 g/dL (ref 30.0–36.0)
MCV: 87.3 fL (ref 78.0–100.0)
RDW: 13.2 % (ref 11.5–15.5)

## 2012-06-05 MED ORDER — PANTOPRAZOLE SODIUM 40 MG PO TBEC: DELAYED_RELEASE_TABLET | ORAL | Status: DC

## 2012-06-05 MED ORDER — PANTOPRAZOLE SODIUM 40 MG PO TBEC: 40.0000 mg | DELAYED_RELEASE_TABLET | Freq: Two times a day (BID) | ORAL | Status: DC

## 2012-06-05 MED ORDER — ACETAMINOPHEN 325 MG PO TABS: 650.0000 mg | ORAL_TABLET | ORAL | Status: AC | PRN

## 2012-06-05 MED ORDER — OXYCODONE-ACETAMINOPHEN 5-325 MG PO TABS
1.0000 | ORAL_TABLET | ORAL | Status: DC | PRN
  Administered 2012-06-05: 2 via ORAL
  Filled 2012-06-05: qty 2

## 2012-06-05 MED ORDER — TAB-A-VITE/IRON PO TABS: 1.0000 | ORAL_TABLET | Freq: Every day | ORAL | Status: AC

## 2012-06-05 NOTE — Discharge Summary (Signed)
General Surgery - [facility]  Agree.  Angelize Ryce M. Nautia Lem, MD, FACS [facility] Office: [phone]   

## 2012-06-05 NOTE — Progress Notes (Signed)
General Surgery [facility]  Patient seen and examined.  Anticipating discharge home later today.  Will follow up with gastroenterology.  Will return to [facility] practice as needed.  Velora Heckler, MD, [facility] Office: [phone]

## 2012-06-05 NOTE — Progress Notes (Signed)
2 Days Post-Op  Subjective: Feeling better, just anxious.  Objective: Vital signs in last 24 hours: Temp:  [97.5 F (36.4 C)-98.2 F (36.8 C)] 97.5 F (36.4 C) (12/30 0630) Pulse Rate:  [58-66] 59  (12/30 0630) Resp:  [16] 16  (12/30 0630) BP: (104-115)/(37-88) 104/37 mmHg (12/30 0630) SpO2:  [100 %] 100 % (12/30 0630) Last BM Date: 06/03/12  Diet: full liquids, afebrile, VSS, H/h is stable  Intake/Output from previous day: 12/29 0701 - 12/30 0700 In: 600 [I.V.:600] Out: -  Intake/Output this shift:    General appearance: alert, cooperative and no distress GI: soft, non-tender; bowel sounds normal; no masses,  no organomegaly  Lab Results:   Basename 06/05/12 0600 06/04/12 0520  WBC 4.7 5.0  HGB 8.4* 8.5*  HCT 24.1* 24.2*  PLT 139* 141*    BMET  Basename 06/04/12 0520 06/03/12 0931  NA 142 138  K 4.1 3.8  CL 110 106  CO2 25 23  GLUCOSE 90 90  BUN 13 22  CREATININE 0.99 0.99  CALCIUM 8.1* 8.1*   PT/INR No results found for this basename: LABPROT:2,INR:2 in the last 72 hours   Lab 06/02/12 2014  AST 19  ALT 19  ALKPHOS 44  BILITOT 0.6  PROT 6.5  ALBUMIN 3.7     Lipase     Component Value Date/Time   LIPASE 18 06/03/2012 0930     Studies/Results: No results found.  Medications:    . amphetamine-dextroamphetamine  20 mg Oral QID  . lamoTRIgine  50 mg Oral BID  . multivitamins with iron  1 tablet Oral Daily  . pantoprazole (PROTONIX) IV  40 mg Intravenous Q12H  . venlafaxine XR  150 mg Oral Q breakfast    Assessment/Plan GI bleed from duodenal ulcer that was likely NSAID-induced   Plan:  Advance diet, change to po PPI, and aim to d/c later, waiting on path to see if he needs Rx for H Pylori. PPI PO BID for 3 months and then QD. Needs to avoid all NSAIDs. Will f/u on bx as outpt. F/U withDr. Bosie Clos in February. Ok to leave tomorrow from GI standpoint if continues to be stable. D/c dilaudid, give him some percocet if he needs something  stronger than tylenol.  I will wait till after lunch if path is not back i will let him go without H pylori rx. He does not need to follow up with our office.   LOS: 3 days    Marguriete Wootan 06/05/2012

## 2012-06-05 NOTE — Discharge Summary (Signed)
Physician Discharge Summary  Patient ID: Ricardo Robbins MRN: [id number] DOB/AGE: Oct 09, 1965 46 y.o.  Admit date: 06/02/2012 Discharge date: 06/05/2012  Admission Diagnoses: Abdominal pain . GI bleed  . Bipolar 1 disorder  . OCD (obsessive compulsive disorder)  . ADHD (attention deficit hyperactivity disorder)  Prior left upper extremity injury with function loss Discharge Diagnoses: GI bleed from duodenal ulcer that was likely NSAID-induced . GI bleed  . Bipolar 1 disorder  . OCD (obsessive compulsive disorder)  . ADHD (attention deficit hyperactivity disorder)  Prior left upper extremity injury with function loss  Principal Problem:  *GI bleed   PROCEDURES: EGD with biopsy, 06/03/2012,  Shirley Friar, MD      Hospital Course: 46 year old male that presents with cramping lower abdominal pain, emesis, and bloody diarrhea. Has no previous history of abdominal pain or GI bleed. Initially writhing in pain, now more comfortable. Pain described as severe. Started earlier today. Denies fever or chills Pt was seen in ER by Dr. Magnus Ivan and workup showed normal labs and CT was unremarkable except for some possible duodenitis.  Liver was also. somewhat abnormal with hypodensities that were not considered urgent, but somewhat abnormal. He was hydrated overnight and the following AM his labs were not drawn, he still complained of pain mid upper epigastric area, and with bowel movement. Rectal exam showed grossly bloody , maroon colored stool.  Dr. Bosie Clos was called, his PPI was increased, he was treated with IV tylenol for a headache and discomfort.   Dr Bosie Clos did an EGD later that day and confirmed the GI bleed.  His hemoglobin dropped from 13 to 9.4,  And then stabliized at 8.4 range.  His diet has been advanced and he is now take PO PPI.  We plan discharge today on PPI bid for 3 months, and then daily after that.  He is to follow up with Dr. Bosie Clos in [place].    Path: Stomach,  biopsy, Distal Stomach - ANTRAL TYPE GASTRIC MUCOSA WITH MILD CHRONIC INACTIVE GASTRITIS AND REACTIVE CHANGES. - WARTHIN-STARRY STAIN IS NEGATIVE FOR HELICOBACTER PYLORI. - NO INTESTINAL METAPLASIA, DYSPLASIA OR MALIGNANCY IDENTIFIED. - SEE COMMENT. Microscopic Comment  Condition on D/C:  Improved     Disposition: 01-Home or Self Care     Medication List     As of 06/05/2012  1:01 PM    STOP taking these medications         ibuprofen 200 MG tablet   Commonly known as: ADVIL,MOTRIN      TAKE these medications         acetaminophen 325 MG tablet   Commonly known as: TYLENOL   Take 2 tablets (650 mg total) by mouth every 4 (four) hours as needed.      ALPRAZolam 1 MG tablet   Commonly known as: XANAX   Take 1 mg by mouth 3 (three) times daily as needed. For anxiety      amphetamine-dextroamphetamine 20 MG tablet   Commonly known as: ADDERALL   Take 20 mg by mouth 4 (four) times daily.      clonazePAM 0.5 MG tablet   Commonly known as: KLONOPIN   Take 0.5 mg by mouth 3 (three) times daily as needed. For anxiety      LamoTRIgine 100 MG Tb24   Take 1 tablet by mouth daily.      multivitamins with iron Tabs   Take 1 tablet by mouth daily.      pantoprazole 40 MG tablet  Commonly known as: PROTONIX   Take 1 tablet (40 mg total) by mouth 2 (two) times daily before a meal.      venlafaxine XR 150 MG 24 hr capsule   Commonly known as: EFFEXOR-XR   Take 1 capsule (150 mg total) by mouth daily with breakfast.           Follow-up Information    Follow up with SCHOOLER,VINCENT C., MD. Schedule an appointment as soon as possible for a visit in 4 weeks. (Aim for 4 weeks to see Dr. Bosie Clos.  You need to be on the Prilosec twice a day for 3 months then daily.  No Motrin, advil, ibuprofen, asprin, or any other form of NSAID. You do not need to folllow up with Dr. Magnus Ivan.)    Contact information:   [address], [address] Jaynie Crumble [place] [address] [phone]          Signed: Sherrie George 06/05/2012, 1:01 PM

## 2012-06-05 NOTE — Clinical Social Work Note (Signed)
Clinical Social Worker received inappropriate referral for medication assistance. CSW notified CM who will be able to assist with medication concerns. CSW signing off.   Rozetta Nunnery MSW, [employer] [phone]

## 2012-06-16 ENCOUNTER — Encounter (HOSPITAL_COMMUNITY): Payer: Self-pay | Admitting: *Deleted

## 2012-06-16 ENCOUNTER — Emergency Department (INDEPENDENT_AMBULATORY_CARE_PROVIDER_SITE_OTHER)
Admission: EM | Admit: 2012-06-16 | Discharge: 2012-06-16 | Disposition: A | Discharge status: Home / Self Care | Payer: Self-pay | Source: Home / Self Care

## 2012-06-16 DIAGNOSIS — M545 Low back pain, unspecified: Secondary | ICD-10-CM

## 2012-06-16 MED ORDER — TRAMADOL HCL 50 MG PO TABS: 50.0000 mg | ORAL_TABLET | Freq: Four times a day (QID) | ORAL | Status: DC | PRN

## 2012-06-16 NOTE — ED Provider Notes (Signed)
Ricardo Robbins is a 47 y.o. male who presents to Urgent Care today for low back pain present for about 6 weeks. Patient has pain in the bilateral lumbar area without any significant radiation. He cannot recall any injury that may have caused the pain. He has intermittent neck pain and this is a particularly bad flare. He notes the pain is moderate worsens activity better with rest. He's been taking NSAIDs but notes that this caused a GI bleed resulting in a hospitalization recently.  He has not taking any medications for pain aside from Tylenol which works only a little.  No fevers chills radiating pain weakness numbness difficulty walking bowel or bladder dysfunction.    PMH reviewed. Significant for bipolar disorder.  Additionally patient has a history of a left brachial plexus injury at 47 years old.  History  Substance Use Topics  . Smoking status: Never Smoker   . Smokeless tobacco: Current User    Types: Chew  . Alcohol Use: 3.6 oz/week    6 Cans of beer per week   ROS as above Medications reviewed. No current facility-administered medications for this encounter.   Current Outpatient Prescriptions  Medication Sig Dispense Refill  . acetaminophen (TYLENOL) 325 MG tablet Take 2 tablets (650 mg total) by mouth every 4 (four) hours as needed.      . ALPRAZolam (XANAX) 1 MG tablet Take 1 mg by mouth 3 (three) times daily as needed. For anxiety      . amphetamine-dextroamphetamine (ADDERALL) 20 MG tablet Take 20 mg by mouth 4 (four) times daily.      . clonazePAM (KLONOPIN) 0.5 MG tablet Take 0.5 mg by mouth 3 (three) times daily as needed. For anxiety      . LamoTRIgine 100 MG TB24 Take 1 tablet by mouth daily.      . Multiple Vitamins-Iron (MULTIVITAMINS WITH IRON) TABS Take 1 tablet by mouth daily.      . pantoprazole (PROTONIX) 40 MG tablet You may use over the counter Prilosec 40 mg twice a day in place of this drug.  60 tablet  2  . traMADol (ULTRAM) 50 MG tablet Take 1 tablet (50 mg  total) by mouth every 6 (six) hours as needed for pain.  60 tablet  1  . venlafaxine XR (EFFEXOR-XR) 150 MG 24 hr capsule Take 1 capsule (150 mg total) by mouth daily with breakfast.  30 capsule  6    Exam:  BP 127/70  Pulse 62  Temp 97.7 F (36.5 C) (Oral)  Resp 20  SpO2 100% Gen: Well NAD BACK: Nontender over spinal midline.  Tender to palpation bilateral paraspinal muscles with spasm noted.  Range of motion limited to flexion extension rotation and lateral flexion secondary to pain.  Patient has a normal gait and can rise from a seated position with ease.  Sensation is intact of bilateral lower extremity.   I reviewed the bony anatomy noted on a abdominal pelvic CT scan from December 27. I do not see any acute abnormalities. He has diffuse disc space narrowing in his thoracic and lumbar spine.   Assessment and Plan: 47 y.o. male with low back pain present for about 6 weeks.   This is likely muscular spasm and dysfunction. He is unable to tolerate NSAIDs secondary to a significant history of GI bleeding and gastritis.  CT of abdomen and pelvis does not show any significant pathology relating to his low back. Plan: Treatment with tramadol as needed for pain.  Patient  will work on obtaining the community access card so that he may attend physical therapy.  He will followup with me in the sports medicine clinic in a few weeks.   Discussed warning signs or symptoms. Please see discharge instructions. Patient expresses understanding.     Rodolph Bong, MD 06/16/12 [phone]

## 2012-06-16 NOTE — ED Notes (Signed)
Pt  Reports  Low  Back pain  Worse  On  Certain    Movements  And  poistions  Hurts  When  He  Bends     He reports  Recently was  In  Hospital  For internal bleeding  -  He          denys  Any urinary  Symptoms   denmys  Any  specefic  recent  Injury

## 2012-06-16 NOTE — ED Provider Notes (Signed)
Medical screening examination/treatment/procedure(s) were performed by a resident physician and as supervising physician I was immediately available for consultation/collaboration.  Leslee Home, M.D.   Reuben Likes, MD 06/16/12 [phone]

## 2012-06-28 ENCOUNTER — Ambulatory Visit: Payer: Self-pay | Admitting: Family Medicine

## 2012-06-28 ENCOUNTER — Inpatient Hospital Stay (HOSPITAL_COMMUNITY)
Admission: EM | Admit: 2012-06-28 | Discharge: 2012-07-02 | DRG: 885 | Disposition: A | Discharge status: Home / Self Care | Payer: Federal, State, Local not specified - Other | Source: Intra-hospital | Attending: Psychiatry | Admitting: Psychiatry

## 2012-06-28 ENCOUNTER — Emergency Department (HOSPITAL_COMMUNITY)
Admission: EM | Admit: 2012-06-28 | Discharge: 2012-06-28 | Disposition: A | Discharge status: Other Acute Inpatient Hospital | Payer: Self-pay | Attending: Emergency Medicine | Admitting: Emergency Medicine

## 2012-06-28 ENCOUNTER — Encounter (HOSPITAL_COMMUNITY): Payer: Self-pay | Admitting: Emergency Medicine

## 2012-06-28 ENCOUNTER — Encounter (HOSPITAL_COMMUNITY): Payer: Self-pay | Admitting: Family Medicine

## 2012-06-28 DIAGNOSIS — F3130 Bipolar disorder, current episode depressed, mild or moderate severity, unspecified: Secondary | ICD-10-CM

## 2012-06-28 DIAGNOSIS — F429 Obsessive-compulsive disorder, unspecified: Secondary | ICD-10-CM | POA: Diagnosis present

## 2012-06-28 DIAGNOSIS — R45851 Suicidal ideations: Secondary | ICD-10-CM

## 2012-06-28 DIAGNOSIS — F411 Generalized anxiety disorder: Secondary | ICD-10-CM

## 2012-06-28 DIAGNOSIS — Z79899 Other long term (current) drug therapy: Secondary | ICD-10-CM | POA: Insufficient documentation

## 2012-06-28 DIAGNOSIS — F319 Bipolar disorder, unspecified: Secondary | ICD-10-CM | POA: Insufficient documentation

## 2012-06-28 DIAGNOSIS — F909 Attention-deficit hyperactivity disorder, unspecified type: Secondary | ICD-10-CM | POA: Insufficient documentation

## 2012-06-28 DIAGNOSIS — F329 Major depressive disorder, single episode, unspecified: Secondary | ICD-10-CM

## 2012-06-28 DIAGNOSIS — Z8659 Personal history of other mental and behavioral disorders: Secondary | ICD-10-CM

## 2012-06-28 DIAGNOSIS — K269 Duodenal ulcer, unspecified as acute or chronic, without hemorrhage or perforation: Secondary | ICD-10-CM | POA: Insufficient documentation

## 2012-06-28 DIAGNOSIS — F17200 Nicotine dependence, unspecified, uncomplicated: Secondary | ICD-10-CM | POA: Insufficient documentation

## 2012-06-28 HISTORY — DX: Duodenal ulcer, unspecified as acute or chronic, without hemorrhage or perforation: K26.9

## 2012-06-28 LAB — CBC
HCT: 37.6 % — ABNORMAL LOW (ref 39.0–52.0)
MCV: 93.3 fL (ref 78.0–100.0)
RBC: 4.03 MIL/uL — ABNORMAL LOW (ref 4.22–5.81)
WBC: 7.6 10*3/uL (ref 4.0–10.5)

## 2012-06-28 LAB — COMPREHENSIVE METABOLIC PANEL
BUN: 17 mg/dL (ref 6–23)
CO2: 29 mEq/L (ref 19–32)
Chloride: 101 mEq/L (ref 96–112)
Creatinine, Ser: 0.93 mg/dL (ref 0.50–1.35)
GFR calc non Af Amer: >90 mL/min (ref >90)
Glucose, Bld: 87 mg/dL (ref 70–99)
Total Bilirubin: 0.2 mg/dL — ABNORMAL LOW (ref 0.3–1.2)

## 2012-06-28 LAB — RAPID URINE DRUG SCREEN, HOSP PERFORMED
Amphetamines: NOT DETECTED
Tetrahydrocannabinol: NOT DETECTED

## 2012-06-28 LAB — SALICYLATE LEVEL: Salicylate Lvl: <2 mg/dL — ABNORMAL LOW (ref 2.8–20.0)

## 2012-06-28 LAB — ETHANOL: Alcohol, Ethyl (B): <11 mg/dL (ref 0–11)

## 2012-06-28 MED ORDER — ACETAMINOPHEN 325 MG PO TABS
650.0000 mg | ORAL_TABLET | Freq: Four times a day (QID) | ORAL | Status: DC | PRN
  Administered 2012-06-29 – 2012-07-02 (×2): 650 mg via ORAL

## 2012-06-28 MED ORDER — TAB-A-VITE/IRON PO TABS
1.0000 | ORAL_TABLET | Freq: Every day | ORAL | Status: DC
  Administered 2012-06-29 – 2012-07-02 (×4): 1 via ORAL
  Filled 2012-06-28 (×6): qty 1

## 2012-06-28 MED ORDER — LAMOTRIGINE ER 100 MG PO TB24
1.0000 | ORAL_TABLET | Freq: Every day | ORAL | Status: DC
  Administered 2012-06-28: 100 mg via ORAL
  Filled 2012-06-28 (×2): qty 1

## 2012-06-28 MED ORDER — LORAZEPAM 1 MG PO TABS
1.0000 mg | ORAL_TABLET | Freq: Three times a day (TID) | ORAL | Status: DC | PRN
  Administered 2012-06-28 (×2): 1 mg via ORAL
  Filled 2012-06-28 (×2): qty 1

## 2012-06-28 MED ORDER — PANTOPRAZOLE SODIUM 40 MG PO TBEC
40.0000 mg | DELAYED_RELEASE_TABLET | Freq: Every day | ORAL | Status: DC
  Administered 2012-06-29 – 2012-07-02 (×4): 40 mg via ORAL
  Filled 2012-06-28 (×6): qty 1

## 2012-06-28 MED ORDER — TRAMADOL HCL 50 MG PO TABS
50.0000 mg | ORAL_TABLET | Freq: Four times a day (QID) | ORAL | Status: DC | PRN
  Administered 2012-06-28 (×2): 50 mg via ORAL
  Filled 2012-06-28 (×2): qty 1

## 2012-06-28 MED ORDER — VENLAFAXINE HCL ER 150 MG PO CP24
150.0000 mg | ORAL_CAPSULE | Freq: Every day | ORAL | Status: DC
  Administered 2012-06-29: 150 mg via ORAL
  Filled 2012-06-28 (×2): qty 1

## 2012-06-28 MED ORDER — TAB-A-VITE/IRON PO TABS
1.0000 | ORAL_TABLET | Freq: Every day | ORAL | Status: DC
  Administered 2012-06-28: 1 via ORAL
  Filled 2012-06-28 (×2): qty 1

## 2012-06-28 MED ORDER — AMPHETAMINE-DEXTROAMPHETAMINE 20 MG PO TABS
20.0000 mg | ORAL_TABLET | Freq: Four times a day (QID) | ORAL | Status: DC
  Administered 2012-06-28 (×3): 20 mg via ORAL
  Filled 2012-06-28 (×3): qty 1

## 2012-06-28 MED ORDER — LAMOTRIGINE ER 100 MG PO TB24: 1.0000 | ORAL_TABLET | Freq: Every day | ORAL | Status: DC

## 2012-06-28 MED ORDER — PANTOPRAZOLE SODIUM 40 MG PO TBEC
40.0000 mg | DELAYED_RELEASE_TABLET | Freq: Every day | ORAL | Status: DC
  Administered 2012-06-28: 40 mg via ORAL
  Filled 2012-06-28: qty 1

## 2012-06-28 MED ORDER — LAMOTRIGINE 100 MG PO TABS
100.0000 mg | ORAL_TABLET | Freq: Every day | ORAL | Status: DC
  Administered 2012-06-29 – 2012-07-02 (×4): 100 mg via ORAL
  Filled 2012-06-28 (×6): qty 1

## 2012-06-28 MED ORDER — AMOXICILLIN 500 MG PO CAPS
500.0000 mg | ORAL_CAPSULE | Freq: Three times a day (TID) | ORAL | Status: DC
  Administered 2012-06-28: 500 mg via ORAL
  Filled 2012-06-28: qty 1

## 2012-06-28 MED ORDER — MAGNESIUM HYDROXIDE 400 MG/5ML PO SUSP: 30.0000 mL | Freq: Every day | ORAL | Status: DC | PRN

## 2012-06-28 MED ORDER — ACETAMINOPHEN 325 MG PO TABS
650.0000 mg | ORAL_TABLET | ORAL | Status: DC | PRN
  Administered 2012-06-28: 650 mg via ORAL

## 2012-06-28 MED ORDER — IBUPROFEN 600 MG PO TABS: 600.0000 mg | ORAL_TABLET | Freq: Three times a day (TID) | ORAL | Status: DC | PRN

## 2012-06-28 MED ORDER — ALUM & MAG HYDROXIDE-SIMETH 200-200-20 MG/5ML PO SUSP: 30.0000 mL | ORAL | Status: DC | PRN

## 2012-06-28 MED ORDER — NICOTINE 21 MG/24HR TD PT24
21.0000 mg | MEDICATED_PATCH | Freq: Once | TRANSDERMAL | Status: DC
  Administered 2012-06-28: 21 mg via TRANSDERMAL
  Filled 2012-06-28: qty 1

## 2012-06-28 MED ORDER — FLUTICASONE PROPIONATE 50 MCG/ACT NA SUSP
2.0000 | Freq: Every day | NASAL | Status: DC
  Administered 2012-06-28 – 2012-07-02 (×5): 2 via NASAL
  Filled 2012-06-28 (×2): qty 16

## 2012-06-28 MED ORDER — CHLORDIAZEPOXIDE HCL 25 MG PO CAPS
25.0000 mg | ORAL_CAPSULE | Freq: Four times a day (QID) | ORAL | Status: DC
  Administered 2012-06-28 – 2012-07-02 (×14): 25 mg via ORAL
  Filled 2012-06-28 (×16): qty 1

## 2012-06-28 MED ORDER — VENLAFAXINE HCL ER 150 MG PO CP24
150.0000 mg | ORAL_CAPSULE | Freq: Every day | ORAL | Status: DC
  Administered 2012-06-28: 150 mg via ORAL
  Filled 2012-06-28 (×3): qty 1

## 2012-06-28 MED ORDER — ACETAMINOPHEN 325 MG PO TABS
650.0000 mg | ORAL_TABLET | ORAL | Status: DC | PRN
  Filled 2012-06-28: qty 2

## 2012-06-28 MED ORDER — NICOTINE 21 MG/24HR TD PT24
21.0000 mg | MEDICATED_PATCH | Freq: Every day | TRANSDERMAL | Status: DC
  Administered 2012-06-29 – 2012-07-02 (×4): 21 mg via TRANSDERMAL
  Filled 2012-06-28 (×7): qty 1

## 2012-06-28 NOTE — Progress Notes (Signed)
Patient ID: Ricardo Robbins, male   DOB: 19-Aug-1965, 47 y.o.   MRN: [id number]  Patient is a 47 yo Voluntary admission with passive SI for several weeks. Pt states he is in between jobs at this time and is living alone. Pt states he has had an increase in anxiety and depression from financial problems. Pt denies specific plan to harm himself and does verbally contract for safety. Pt pleasant and cooperative but does endorse depression with dull, flat affect. Pt with no use of his left arm d/t brachial plexus but is able to perform all ADLs independently. Pt's medications in locker #11. Q 15 minute safety checks initiated per protocol. No distress noted at this time.
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
## 2012-06-28 NOTE — Progress Notes (Signed)
D: Patient in the hallway on first approach.  Patient appears anxious and needy.  Patient states he take xanax 4 times a day.  Patient state she has been depressed.  Patient states he is having Passive SI but verbally contracts for safety.  Patient denies SI and denies AVH. A: Staff to monitor Q 15 mins for safety.  Encouragement and support offered.  Scheduled medications administered per per orders.  Trazodone administered prn for sleep. R: Patient remains safe on the unit.  Patient visible on the unit.  Patient taking administered mddications

## 2012-06-28 NOTE — ED Notes (Addendum)
Report given to Jerry Caras RN. Pt moved to room #41. Belongings moved from locker 29 to locker 41

## 2012-06-28 NOTE — ED Notes (Signed)
Pt was placed in blue paper scrubs. Pt unable to urinate at this time.
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
## 2012-06-28 NOTE — ED Notes (Signed)
C/o anxiety. Prn provided

## 2012-06-28 NOTE — ED Provider Notes (Signed)
8:35 AM Filed Vitals:   06/28/12 0630  BP: 121/67  Pulse: 50  Temp: 98.8 F (37.1 C)  Resp: 16   telepsych this AM for depression/bipolar. SI  No complaints this AM  Lyanne Co, MD 06/28/12 [phone]

## 2012-06-28 NOTE — ED Notes (Signed)
Pt soundly asleep, will remind of the need for urine when pt wakes

## 2012-06-28 NOTE — ED Notes (Signed)
Refusing tele-psych stating he wants to talk to a real person

## 2012-06-28 NOTE — Progress Notes (Signed)
Patient ID: Ricardo Robbins, male   DOB: [birth year], 47 y.o.   MRN: [id number] Report given to Morrie Sheldon, [place]

## 2012-06-28 NOTE — ED Notes (Signed)
Ricardo Robbins, pt's friend asks to be called if pt needs anything. Her cell phone number is [phone].

## 2012-06-28 NOTE — ED Notes (Signed)
[place], MD at bedside. 

## 2012-06-28 NOTE — ED Notes (Addendum)
Patient states he is having issues with his bipolar. States "I am in a down swing." States "I cry all day long and I am becoming unproductive." Reports having SI.

## 2012-06-28 NOTE — ED Notes (Signed)
Cancel Telepsych per Dr. Dierdre Highman.

## 2012-06-28 NOTE — ED Notes (Signed)
Pt requesting Amoxicillin for Sinus infection. Note left with MD

## 2012-06-28 NOTE — ED Notes (Signed)
Telepsych request faxed to Specialist on-call.

## 2012-06-28 NOTE — ED Notes (Signed)
Patient's belongings given to L. White (friend) to take home.

## 2012-06-28 NOTE — Tx Team (Signed)
Initial Interdisciplinary Treatment Plan  PATIENT STRENGTHS: (choose at least two) Ability for insight Average or above average intelligence Capable of independent living Communication skills General fund of knowledge Motivation for treatment/growth Physical Health Supportive family/friends Work skills  PATIENT STRESSORS: Financial difficulties Loss of employment*   PROBLEM LIST: Problem List/Patient Goals Date to be addressed Date deferred Reason deferred Estimated date of resolution  Depression 06/28/12     Suicide Risk 06/28/12                                                DISCHARGE CRITERIA:  Ability to meet basic life and health needs Improved stabilization in mood, thinking, and/or behavior Motivation to continue treatment in a less acute level of care Need for constant or close observation no longer present Reduction of life-threatening or endangering symptoms to within safe limits Verbal commitment to aftercare and medication compliance  PRELIMINARY DISCHARGE PLAN: Participate in family therapy Return to previous living arrangement  PATIENT/FAMIILY INVOLVEMENT: This treatment plan has been presented to and reviewed with the patient, SALLY REIMERS, and/or family member, .  The patient and family have been given the opportunity to ask questions and make suggestions.  Renaee Munda 06/28/2012, 9:11 PM

## 2012-06-29 DIAGNOSIS — F3130 Bipolar disorder, current episode depressed, mild or moderate severity, unspecified: Principal | ICD-10-CM

## 2012-06-29 MED ORDER — VENLAFAXINE HCL ER 75 MG PO CP24
225.0000 mg | ORAL_CAPSULE | Freq: Every day | ORAL | Status: DC
  Administered 2012-06-30 – 2012-07-02 (×3): 225 mg via ORAL
  Filled 2012-06-29 (×5): qty 1

## 2012-06-29 MED ORDER — TRAZODONE HCL 50 MG PO TABS
50.0000 mg | ORAL_TABLET | Freq: Every evening | ORAL | Status: DC | PRN
  Administered 2012-06-29 (×4): 50 mg via ORAL
  Filled 2012-06-29 (×2): qty 1

## 2012-06-29 NOTE — Progress Notes (Signed)
[facility] LCSW Group Therapy  Living a Balance Life  06/29/2012 3:39 PM  Type of Therapy:  Group Therapy  Participation Level:  Active  Participation Quality:  Appropriate and Attentive  Affect:  Appropriate and Depressed  Cognitive:  Alert and Appropriate  Insight:  Developing/Improving  Engagement in Therapy:  Developing/Improving  Modes of Intervention:  Clarification, Confrontation, Discussion, Education, Exploration, Problem-solving, Rapport Building and Support   Summary of Progress/Problems:  Patient listened attentively to speaker from [employer]. He shared that while he is out of out, he can add exercise to the 20 plus hours he looks for work as a way of balancing his life.  Patient shared he is interested in programs offered through [facility].  Wynn Banker 06/29/2012, 3:39 PM

## 2012-06-29 NOTE — [facility] Counselor (Signed)
Adult Comprehensive Assessment  Patient ID: Ricardo Robbins, male   DOB: [birth year], 47 y.o.   MRN: [id number]  Information Source:    Current Stressors:  Educational / Learning stressors: None Employment / Job issues: Patient was terminated from his job in November Family Relationships: Separated from wife Surveyor, quantity / Lack of resources (include bankruptcy): No money due to be unemployed Housing / Lack of housing: Patient is living out of his RV Physical health (include injuries & life threatening diseases): Neck and back spasms Social relationships: None Substance abuse: Using  Opiods obtained from friends and off the street Bereavement / Loss: None  Living/Environment/Situation:  Living Arrangements: Alone Living conditions (as described by patient or guardian): Living in a RV How long has patient lived in current situation?: Two years What is atmosphere in current home: Temporary  Family History:  Marital status: Separated Separated, when?:  A year and an half What types of issues is patient dealing with in the relationship?: Wife is no longer wanting him to see son because of drug abuse and the people he hangs around Does patient have children?: Yes How many children?: 1  How is patient's relationship with their children?: Okay but not great  Childhood History:  By whom was/is the patient raised?: Mother Additional childhood history information: Okay Description of patient's relationship with caregiver when they were a child: Fair Patient's description of current relationship with people who raised him/her: Patient stated he and mother/stepfather are building a relationship. Does patient have siblings?: Yes Number of Siblings: 4  Description of patient's current relationship with siblings: No relationship Did patient suffer any verbal/emotional/physical/sexual abuse as a child?: No Did patient suffer from severe childhood neglect?: No Has patient ever been sexually  abused/assaulted/raped as an adolescent or adult?: No Was the patient ever a victim of a crime or a disaster?: No Witnessed domestic violence?: No Has patient been effected by domestic violence as an adult?: No  Education:  Highest grade of school patient has completed: Masters Degree Currently a Consulting civil engineer?: No Learning disability?: No  Employment/Work Situation:   Employment situation: Unemployed What is the longest time patient has a held a job?: 18 years Where was the patient employed at that time?: [employer] Has patient ever been in the [employer]?: No Has patient ever served in Buyer, retail?: No  Financial Resources:   Surveyor, quantity resources: No income Does patient have a Lawyer or guardian?: No  Alcohol/Substance Abuse:   What has been your use of drugs/alcohol within the last 12 months?: Recently started using opiod over the past two months If attempted suicide, did drugs/alcohol play a role in this?: No Alcohol/Substance Abuse Treatment Hx: Denies past history Has alcohol/substance abuse ever caused legal problems?: Yes (Reports a DWI in April 2013)  Social Support System:   [employer] Support System: None Type of faith/religion: None How does patient's faith help to cope with current illness?: N/A  Leisure/Recreation:   Leisure and Hobbies: [employer]  Strengths/Needs:   What things does the patient do well?: His work In what areas does patient struggle / problems for patient: Remorse/regrets for not being able to keep his family together  Discharge Plan:   Does patient have access to transportation?: Yes Will patient be returning to same living situation after discharge?: Yes Currently receiving community mental health services: Yes (From Whom) (Dr. Evelene Croon) If no, would patient like referral for services when discharged?: No Does patient have financial barriers related to discharge medications?: Yes Patient description of barriers  related to discharge  medications: Patient does not insurance or income  Summary/Recommendations:  Ricardo Robbins is a 47 year old Caucasian male who admitted with MDD.  He will Patient will benefit from crisis stabilization, evaluation for medication management, psycho education groups for coping skills development, group therapy and assistance with discharge planning.     Ricardo Robbins, Ricardo Robbins. 06/29/2012

## 2012-06-29 NOTE — Progress Notes (Signed)
Adult Psychoeducational Group Note  Date:  06/29/2012 Time:  11:57 AM  Group Topic/Focus:  Overcoming Stress:   The focus of this group is to define stress and help patients assess their triggers.  Participation Level:  Active  Participation Quality:  Appropriate, Attentive, Sharing and Supportive  Affect:  Anxious  Cognitive:  Alert, Appropriate and Oriented  Insight: Appropriate and Good  Engagement in Group:  Engaged and Supportive  Modes of Intervention:  Activity, Discussion, Education and Support  Additional Comments:  Patient appropriate and supportive. Patient shared favorite "stress release" activity is mountain biking. Patient describes techniques/skills he can use to manage stress moving forward.   Ricardo Robbins 06/29/2012, 11:57 AM

## 2012-06-29 NOTE — Progress Notes (Signed)
  D) Patient pleasant and cooperative upon my assessment. Patient verbalizes "I don't understand why I can't have have my home medications, my xanax and adderall." Explained to patient we can discuss with MD medications, patient verbalizes understanding. Patient completed Patient Self Inventory, reports slept "poor," and  appetite is "improving." Patient rates depression as   7/10, patient rates hopeless feelings as 6 /10. Patient endorses "off and on" SI, contracts verbally for safety with RN. Patient denies HI, denies A/V hallucinations.   A) Patient offered support and encouragement, patient encouraged to discuss feelings/concerns with staff. Patient verbalized understanding. Patient monitored Q15 minutes for safety. Patient met with MD  to discuss today's goals and plan of care.  R) Patient visible in milieu, attending groups in day room and meals in dining room. Patient appropriate with staff and peers.   Patient taking medications as ordered. Will continue to monitor.

## 2012-06-29 NOTE — [facility] Suicide Risk Assessment (Signed)
Suicide Risk Assessment  Admission Assessment     Nursing information obtained from:  Patient Demographic factors:  Male;Caucasian;Living alone;Unemployed Current Mental Status:  Suicidal ideation indicated by patient Loss Factors:  Decrease in vocational status;Financial problems / change in socioeconomic status Historical Factors:    Risk Reduction Factors:  Sense of responsibility to family;Positive therapeutic relationship  CLINICAL FACTORS:   Bipolar Disorder:   Depressive phase  COGNITIVE FEATURES THAT CONTRIBUTE TO RISK:  Thought constriction (tunnel vision)    SUICIDE RISK:   Moderate:  Frequent suicidal ideation with limited intensity, and duration, some specificity in terms of plans, no associated intent, good self-control, limited dysphoria/symptomatology, some risk factors present, and identifiable protective factors, including available and accessible social support.  PLAN OF CARE: Adjust medications accordingly. Encourage patient to attend groups. Plan for discharge when safe and stable.  I certify that inpatient services furnished can reasonably be expected to improve the patient's condition.  Florencia Zaccaro 06/29/2012, 10:41 AM

## 2012-06-29 NOTE — Progress Notes (Signed)
[facility] INPATIENT:  Family/Significant Other Suicide Prevention Education  Suicide Prevention Education:  Education Completed; Docia Chuck, Stepfather, [phone] has been identified by the patient as the family member/significant other with whom the patient will be residing, and identified as the person(s) who will aid the patient in the event of a mental health crisis (suicidal ideations/suicide attempt).  With written consent from the patient, the family member/significant other has been provided the following suicide prevention education, prior to the and/or following the discharge of the patient.  The suicide prevention education provided includes the following:  Suicide risk factors  Suicide prevention and interventions  National Suicide Hotline telephone number  [facility] assessment telephone number  [facility] Emergency Assistance 911  [facility] and/or Residential Mobile Crisis Unit telephone number  Request made of family/significant other to:  Remove weapons (e.g., guns, rifles, knives), all items previously/currently identified as safety concern.  Father reports patient does not have a gun to his knowledge.  Remove drugs/medications (over-the-counter, prescriptions, illicit drugs), all items previously/currently identified as a safety concern.  The family member/significant other verbalizes understanding of the suicide prevention education information provided.  The family member/significant other agrees to remove the items of safety concern listed above.  Wynn Banker 06/29/2012, 3:37 PM

## 2012-06-29 NOTE — Progress Notes (Signed)
Adult Psychoeducational Group Note  Date:  06/29/2012 Time:  11:23 AM  Group Topic/Focus:  Therapeutic activity  Participation Level:  Active  Participation Quality:  Appropriate  Affect:  Appropriate  Cognitive:  Appropriate  Insight: Good  Engagement in Group:  Engaged  Modes of Intervention:  Support  Additional Comments:  Pt participated and processed in group.  Marquis Lunch, Tajai Ihde 06/29/2012, 11:23 AM

## 2012-06-29 NOTE — Progress Notes (Signed)
[facility], [place] LCSW Aftercare Discharge Planning Group Note  06/29/2012 10:17 AM  Participation Quality:  Appropriate and Attentive  Affect:  Depressed  Cognitive:  Alert and Appropriate  Insight:  Engaged and Improving  Engagement in Group:  Engaged  Modes of Intervention:  Clarification, Discussion, Education, Exploration, Problem-solving, Rapport Building, Socialization and Support    Summary of Progress/Problems:Pt attended discharge planning group and actively participated in group. CSW provided pt with today's workbook. Pt reports feeling "ok" today and kind of "in the middle of the road." Pt states that he came to the hospital due becoming over whelmed. Pt shares that he lost job and for the first time in his life is unemployed.  He rates depression at 4 and anxiety at 8. Pt denies SI/HI. Pt is a Armed forces operational officer resident. He reports that he sees Dr. Lafayette Dragon for medication management and would like to have a therapist as well.   Pts affect was depressed.  However, his outlook is bright. He states that he is getting balanced on his medications and is having a hard time concentrating.  Ricardo Robbins L 06/29/2012, 10:17 AM
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
## 2012-06-30 DIAGNOSIS — F319 Bipolar disorder, unspecified: Secondary | ICD-10-CM

## 2012-06-30 MED ORDER — TRAZODONE HCL 100 MG PO TABS
100.0000 mg | ORAL_TABLET | Freq: Every evening | ORAL | Status: DC | PRN
  Administered 2012-06-30 – 2012-07-01 (×4): 100 mg via ORAL
  Filled 2012-06-30 (×4): qty 1

## 2012-06-30 NOTE — Progress Notes (Addendum)
Adult Psychoeducational Group Note  Date:  06/30/2012 Time:  3:01 PM  Group Topic/Focus:  Early Warning Signs:   The focus of this group is to help patients identify signs or symptoms they exhibit before slipping into an unhealthy state or crisis.  Participation Level:  Active  Participation Quality:  Appropriate  Affect:  Appropriate  Cognitive:  Appropriate  Insight: Good  Engagement in Group:  Supportive  Modes of Intervention:  Discussion, Education, Problem-solving and Rapport Building  Additional Comments: Pt shared the reason for hospitalization and reported drug use before treatment. Pt reported that he is unemployed and interacts with drug users and realizes he makes poor choices. Pt identified financial issues as a trigger for depression. Pt reported that early signs of relapse are depressive thoughts and involvement in risky environments. Pt agreed to utilize positive coping skills in efforts to refrain from impulsive behavior.   Maeola Sarah 06/30/2012, 3:01 PM

## 2012-06-30 NOTE — Clinical Social Work Note (Signed)
Writer returned call to patient's stepfather who expressed concerns about patient discharging tomorrow.  Stepfather shared he will not be able to get to [facility] until Sunday night.  He shared patient has been abusing drugs and has withdrawn over $20,000 from his retirement. He stated patient used money to pay cash for an RV.  Writer asked stepfather if he could provide any clinical information as patient does not meet inpatient criteria at this time.  He was unable to do so.

## 2012-06-30 NOTE — Tx Team (Signed)
Interdisciplinary Treatment Plan Update (Adult)  Date:  06/30/2012  Time Reviewed:  12:33 PM   Progress in Treatment: Attending groups:   Yes   Participating in groups:  Yes Taking medication as prescribed:  Yes Tolerating medication:  Yes Family/Significant othe contact made: Contact made with family Patient understands diagnosis:  Yes Discussing patient identified problems/goals with staff: Yes Medical problems stabilized or resolved: Yes Denies suicidal/homicidal ideation:Yes Issues/concerns per patient self-inventory:  Other:   New problem(s) identified:  Reason for Continuation of Hospitalization: Anxiety Medication stabilization   Interventions implemented related to continuation of hospitalization:  Medication Management; safety checks q 15 mins  Additional comments:  Estimated length of stay:  1 day Discharge Plan:  Home with outpatient follow up  New goal(s):  Review of initial/current patient goals per problem list:    1.  Goal(s): Eliminate SI/other thoughts of self harm  (Patient will no longer endorse SI/other thought self harm)    Met:  Yes  Target date: d/c  As evidenced by: Patient is not endorsing SI/HI Or thoughts of self harm  2.  Goal (s):Reduce depression/anxiety (Patient will rate symptoms at four or less)  Met: Yes  Target date: d/c  As evidenced by: Patient rates depression and one and anxiety at four    3.  Goal(s):.stabilize on meds ( Patient will report being stable on medications - less symptomatic)   Met:  Yes  Target date: d/c  As evidenced by: Patient reports being stabilized on medications    4.  Goal(s): Refer for outpatient follow up (Appointment will be scheduled   Met:  Yes  Target date: d/c  As evidenced by: Follow up scheduled with Dr[facility] Evel[facility]on and [facility]      Attendees: Patient:   06/30/2012 12:33 PM  Physican:  Patrick North, MD 06/30/2012 12:33 PM  Nursing:  Harold Barban, RN  06/30/2012 12:33 PM   Nursing:   Berneice Heinrich, RN 06/30/2012 12:33 PM   Clinical Social Worker:  Juline Patch, LCSW 06/30/2012 12:33 PM   Other:  06/30/2012 12:33 PM   Other:   06/30/2012 12:33 PM Other:        06/30/2012 12:33 PM

## 2012-06-30 NOTE — Progress Notes (Signed)
Patient ID: Ricardo Robbins, male   DOB: 1966/06/06, 47 y.o.   MRN: [id number]  D: Patient pleasant on approach tonight. States that he was really irritable and moody this am but his mood has improved a lot since then. Patient believes a lot of it is due to not getting enough sleep. It took patient a long time to go to sleep last night. Patient went to [facility] group tonight and said it was good. A: Staff will monitor on q 15 minute checks, follow treatment plan and give meds as ordered. R: Attending groups and taking meds without issue.

## 2012-06-30 NOTE — Progress Notes (Signed)
[facility] LCSW Aftercare Discharge Planning Group Note  06/30/2012 12:03 PM  Participation Quality:  Appropriate  Affect:  Appropriate and Depressed  Cognitive:  Alert and Appropriate  Insight:  Engaged  Engagement in Group:  Engaged  Modes of Intervention:  Discussion, Education, Exploration, Problem-solving, Rapport Building and Support    Summary of Progress/Problems:  Patient reports doing well and hopes to discharge over the weekend.  He denies SI/HI and rates depression at two and anxiety at four.  Wynn Banker 06/30/2012, 12:03 PM

## 2012-06-30 NOTE — Progress Notes (Signed)
[facility] MD Progress Note  06/30/2012 10:10 AM Ricardo Robbins  MRN:  [id number] Subjective:  Patient reports doing slightly better. Tolerating the increase in effexor without side effects. Denies suicidal thoughts. He has found a job and would like to get started on it. Complaining of trouble sleeping.  Diagnosis:   Axis I: Bipolar, mixed Axis II: Deferred Axis III:  Past Medical History  Diagnosis Date  . ADHD (attention deficit hyperactivity disorder)   . Bipolar 1 disorder     "I get some ups but I don't get downs"  . OCD (obsessive compulsive disorder)   . Complication of anesthesia     nausea/vomitting  . Bipolar 1 disorder 06/05/2012  . OCD (obsessive compulsive disorder) 06/05/2012  . Duodenal ulcer disease    Axis IV: occupational problems and other psychosocial or environmental problems Axis V: 51-60 moderate symptoms  ADL's:  Intact  Sleep: Poor  Appetite:  Fair   Psychiatric Specialty Exam: Review of Systems  Constitutional: Negative.   HENT: Negative.   Eyes: Negative.   Respiratory: Negative.   Cardiovascular: Negative.   Gastrointestinal: Negative.   Genitourinary: Negative.   Musculoskeletal: Positive for back pain.  Skin: Negative.   Neurological: Negative.   Endo/Heme/Allergies: Negative.   Psychiatric/Behavioral: Positive for depression. The patient is nervous/anxious.     Blood pressure 104/64, pulse 71, temperature 97.3 F (36.3 C), temperature source Oral, resp. rate 16, height 6\' 1"  (1.854 m), weight 99.338 kg (219 lb).Body mass index is 28.89 kg/(m^2).  General Appearance: Casual  Eye Contact::  Fair  Speech:  Clear and Coherent  Volume:  Decreased  Mood:  Depressed  Affect:  Constricted and Flat  Thought Process:  Coherent  Orientation:  Full (Time, Place, and Person)  Thought Content:  WDL  Suicidal Thoughts:  No  Homicidal Thoughts:  No  Memory:  Immediate;   Fair Recent;   Fair Remote;   Fair  Judgement:  Fair  Insight:  Fair    Psychomotor Activity:  Decreased  Concentration:  Fair  Recall:  Fair  Akathisia:  No  Handed:  Right  AIMS (if indicated):     Assets:  Communication Skills Desire for Improvement Social Support  Sleep:  Number of Hours: 4    Current Medications: Current Facility-Administered Medications  Medication Dose Route Frequency Provider Last Rate Last Dose  . acetaminophen (TYLENOL) tablet 650 mg  650 mg Oral Q6H PRN Nehemiah Settle, MD   650 mg at 06/29/12 0848  . alum & mag hydroxide-simeth (MAALOX/MYLANTA) 200-200-20 MG/5ML suspension 30 mL  30 mL Oral Q4H PRN Nehemiah Settle, MD      . chlordiazePOXIDE (LIBRIUM) capsule 25 mg  25 mg Oral QID Kerry Hough, PA   25 mg at 06/30/12 0745  . fluticasone (FLONASE) 50 MCG/ACT nasal spray 2 spray  2 spray Each Nare Daily Kerry Hough, PA   2 spray at 06/30/12 0746  . lamoTRIgine (LAMICTAL) tablet 100 mg  100 mg Oral Daily Nehemiah Settle, MD   100 mg at 06/30/12 0745  . magnesium hydroxide (MILK OF MAGNESIA) suspension 30 mL  30 mL Oral Daily PRN Nehemiah Settle, MD      . multivitamins with iron tablet 1 tablet  1 tablet Oral Daily Nehemiah Settle, MD   1 tablet at 06/30/12 0745  . nicotine (NICODERM CQ - dosed in mg/24 hours) patch 21 mg  21 mg Transdermal Q0600 Nehemiah Settle, MD   21 mg  at 06/30/12 0657  . pantoprazole (PROTONIX) EC tablet 40 mg  40 mg Oral Daily Nehemiah Settle, MD   40 mg at 06/30/12 0745  . traZODone (DESYREL) tablet 50 mg  50 mg Oral QHS PRN,MR X 1 Kerry Hough, PA   50 mg at 06/29/12 2300  . venlafaxine XR (EFFEXOR-XR) 24 hr capsule 225 mg  225 mg Oral Q breakfast Jayvyn Haselton, MD   225 mg at 06/30/12 0745    Lab Results:  Results for orders placed during the hospital encounter of 06/28/12 (from the past 48 hour(s))  URINE RAPID DRUG SCREEN (HOSP PERFORMED)     Status: Abnormal   Collection Time   06/28/12 11:11 AM      Component Value  Range Comment   Opiates NONE DETECTED  NONE DETECTED    Cocaine NONE DETECTED  NONE DETECTED    Benzodiazepines POSITIVE (*) NONE DETECTED    Amphetamines NONE DETECTED  NONE DETECTED    Tetrahydrocannabinol NONE DETECTED  NONE DETECTED    Barbiturates NONE DETECTED  NONE DETECTED     Physical Findings: AIMS: Facial and Oral Movements Muscles of Facial Expression: None, normal Lips and Perioral Area: None, normal Jaw: None, normal Tongue: None, normal,Extremity Movements Upper (arms, wrists, hands, fingers): None, normal Lower (legs, knees, ankles, toes): None, normal, Trunk Movements Neck, shoulders, hips: None, normal, Overall Severity Severity of abnormal movements (highest score from questions above): None, normal Incapacitation due to abnormal movements: None, normal Patient's awareness of abnormal movements (rate only patient's report): No Awareness, Dental Status Current problems with teeth and/or dentures?: No Does patient usually wear dentures?: No  CIWA:  CIWA-Ar Total: 0  COWS:  COWS Total Score: 0   Treatment Plan Summary: Daily contact with patient to assess and evaluate symptoms and progress in treatment Medication management  Plan: Continue current plan of care. Plan for discharge tomorrow.  Medical Decision Making Problem Points:  Established problem, stable/improving (1), Review of last therapy session (1) and Review of psycho-social stressors (1) Data Points:  Review of medication regiment & side effects (2) Review of new medications or change in dosage (2)  I certify that inpatient services furnished can reasonably be expected to improve the patient's condition.   Deandra Goering 06/30/2012, 10:10 AM

## 2012-06-30 NOTE — Progress Notes (Signed)
Reviewed. Agree. Andres Shad, MSW Clinical Lead [phone]

## 2012-06-30 NOTE — Progress Notes (Signed)
[facility] LCSW Group Therapy  Living a Balance Life  06/30/2012 3:35 PM  Type of Therapy:  Group Therapy  Participation Level:  Active  Participation Quality:  Appropriate and Attentive  Affect:  Appropriate and Depressed  Cognitive:  Alert and Appropriate  Insight:  Developing/Improving  Engagement in Therapy:  Developing/Improving  Modes of Intervention:  Clarification, Confrontation, Discussion, Education, Exploration, Problem-solving, Rapport Building and Support   Summary of Progress/Problems:  Patient shared in able for him not to relapse, he will need to shift his priority and focus on things that are important such as finding work.  He also stated he would need to be intentional about not calling those who would support him in his negative behaviors.  Ricardo Robbins 06/30/2012, 3:35 PM

## 2012-06-30 NOTE — Progress Notes (Signed)
  D) Patient pleasant and cooperative upon my assessment. Patient continues to appear sad at times. Patient completed Patient Self Inventory, reports slept "poor," and  appetite is "good." Patient rates depression as  5 /10, patient rates hopeless feelings as  5/10. Patient endorses "off and on" SI, contracts verbally for safety with RN. Patient denies HI, denies A/V hallucinations.   A) Patient offered support and encouragement, patient encouraged to discuss feelings/concerns with staff. Patient verbalized understanding. Patient monitored Q15 minutes for safety. Patient met with MD  to discuss today's goals and plan of care.  R) Patient visible in milieu, attending groups in day room and meals in dining room. Patient appropriate with staff and peers.   Patient taking medications as ordered. Will continue to monitor.

## 2012-06-30 NOTE — Progress Notes (Signed)
[facility] Adult Case Management Discharge Plan :  Will you be returning to the same living situation after discharge: Yes,  Patient to return to previous living arrangement At discharge, do you have transportation home?:Yes,  Patient to arrange transporation Do you have the ability to pay for your medications:No.  Patient to be assisted with indigent medications.  Release of information consent forms completed and in the chart;  Patient's signature needed at discharge.  Patient to Follow up at: Follow-up Information    Follow up with [facility]. On 07/03/2012. (You are scheduled with [facility] for counseling on Tuesday, July 04, 2012 at 11:00 AM)    Contact information:   [address] [place], [place]   [address]  [phone]      Follow up with Dr. Evelene Croon. On 07/20/2012. (You are are scheduled with Dr. Evelene Croon on Thursday, Februatry 13, 2013 at 5:30 PM)    Contact information:   [address] [place], [place]   [address]  [phone]         Patient denies SI/HI:   Yes,  Patient is not endorsing SI/HI or thoughts of self harm    Safety Planning and Suicide Prevention discussed:  Yes,  Reviewed during after care group  Jamis Kryder, [name] 06/30/2012, 12:30 PM

## 2012-06-30 NOTE — Progress Notes (Signed)
Patient ID: Ricardo Robbins, male   DOB: Jan 27, 1966, 47 y.o.   MRN: [id number] D: Patient pleasant on approach tonight. Reports mood better and currently denies any SI at present. Reports feeling even right now. Talking about how he cannot concentrate well without the Adderall medication that he was on prior to hospitalization. Reports poor sleep last night as well. A: Staff will monitor on q 15 minute checks, follow treatment plan, and give meds as ordered. R: Taking meds without issue including sleep med and attended group tonight.

## 2012-07-01 MED ORDER — DM-GUAIFENESIN ER 30-600 MG PO TB12
1.0000 | ORAL_TABLET | Freq: Two times a day (BID) | ORAL | Status: DC
Start: 2012-07-01 — End: 2012-07-02
  Administered 2012-07-01 – 2012-07-02 (×2): 1 via ORAL
  Filled 2012-07-01 (×6): qty 1

## 2012-07-01 MED ORDER — SODIUM CHLORIDE 0.9 % IN NEBU
3.0000 mL | INHALATION_SOLUTION | RESPIRATORY_TRACT | Status: DC | PRN
  Administered 2012-07-01: 3 mL via RESPIRATORY_TRACT

## 2012-07-01 NOTE — Progress Notes (Signed)
D: Pt was in bed resting quietly with eyes closed on approach. Appeared anxious. Calm and cooperative with assessment. No acute distress noted. Stated he was feeling much better. Stated he had not slept well the entire time he had been at [facility] r/t medications not working right or his need to adjust to them. Stated, last night, he slept the best he had since being here and he feels like his mood is much better as a result. Did not fill out his pt inventory and did not attend 0900 group. Denied SI/HI/AVH and contracted for safety. Denied pain or discomfort. Offered no questions or concerns.  A: Safety has been maintained with Q15 min observation. Support and encouragement provided. Medications and POC for the shift reviewed and understanding verbalized. Encouraged pt to work on his self inventory and also encouraged group participation.  R: Pt is anxious and cooperative. He has not been following unit programming. Will continue Q15 minute observation, encourage group and unit programming and continue current POC.

## 2012-07-01 NOTE — Progress Notes (Signed)
Patient ID: Ricardo Robbins, male   DOB: 1965-11-11, 47 y.o.   MRN: [id number] [facility] MD Progress Note  07/01/2012 4:47 PM Ricardo Robbins  MRN:  [id number] Subjective:  Slept well but cold is coming back. Coughing is keeping him up at night. Nurse last night talked him into getting some SA treatment. Wants to go to job interview Thursday in [place] to get finances in better shape but family want shim well.  Diagnosis:   Axis I: Bipolar, mixed Axis II: Deferred Axis III:  Past Medical History  Diagnosis Date  . ADHD (attention deficit hyperactivity disorder)   . Bipolar 1 disorder     "I get some ups but I don't get downs"  . OCD (obsessive compulsive disorder)   . Complication of anesthesia     nausea/vomitting  . Bipolar 1 disorder 06/05/2012  . OCD (obsessive compulsive disorder) 06/05/2012  . Duodenal ulcer disease    Axis IV: occupational problems and other psychosocial or environmental problems Axis V: 51-60 moderate symptoms  ADL's:  Intact  Sleep: Poor  Appetite:  Fair   Psychiatric Specialty Exam: Review of Systems  Constitutional: Negative.   HENT: Negative.   Eyes: Negative.   Respiratory: Negative.   Cardiovascular: Negative.   Gastrointestinal: Negative.   Genitourinary: Negative.   Musculoskeletal: Positive for back pain.  Skin: Negative.   Neurological: Negative.   Endo/Heme/Allergies: Negative.   Psychiatric/Behavioral: Positive for depression. The patient is nervous/anxious.     Blood pressure 90/58, pulse 81, temperature 98.1 F (36.7 C), temperature source Oral, resp. rate 16, height 6\' 1"  (1.854 m), weight 99.338 kg (219 lb).Body mass index is 28.89 kg/(m^2).  General Appearance: Casual  Eye Contact::  Fair  Speech:  Clear and Coherent  Volume:  Decreased  Mood:  Depressed  Affect:  Constricted and Flat  Thought Process:  Coherent  Orientation:  Full (Time, Place, and Person)  Thought Content:  WDL  Suicidal Thoughts:  No  Homicidal Thoughts:   No  Memory:  Immediate;   Fair Recent;   Fair Remote;   Fair  Judgement:  Fair  Insight:  Fair  Psychomotor Activity:  Decreased  Concentration:  Fair  Recall:  Fair  Akathisia:  No  Handed:  Right  AIMS (if indicated):     Assets:  Communication Skills Desire for Improvement Social Support  Sleep:  Number of Hours: 6.25    Current Medications: Current Facility-Administered Medications  Medication Dose Route Frequency Provider Last Rate Last Dose  . acetaminophen (TYLENOL) tablet 650 mg  650 mg Oral Q6H PRN Nehemiah Settle, MD   650 mg at 06/29/12 0848  . alum & mag hydroxide-simeth (MAALOX/MYLANTA) 200-200-20 MG/5ML suspension 30 mL  30 mL Oral Q4H PRN Nehemiah Settle, MD      . chlordiazePOXIDE (LIBRIUM) capsule 25 mg  25 mg Oral QID Kerry Hough, PA   25 mg at 07/01/12 1300  . fluticasone (FLONASE) 50 MCG/ACT nasal spray 2 spray  2 spray Each Nare Daily Kerry Hough, PA   2 spray at 07/01/12 [id number]  . lamoTRIgine (LAMICTAL) tablet 100 mg  100 mg Oral Daily Nehemiah Settle, MD   100 mg at 07/01/12 0837  . magnesium hydroxide (MILK OF MAGNESIA) suspension 30 mL  30 mL Oral Daily PRN Nehemiah Settle, MD      . multivitamins with iron tablet 1 tablet  1 tablet Oral Daily Nehemiah Settle, MD   1 tablet at 07/01/12  1610  . nicotine (NICODERM CQ - dosed in mg/24 hours) patch 21 mg  21 mg Transdermal Q0600 Nehemiah Settle, MD   21 mg at 07/01/12 9604  . pantoprazole (PROTONIX) EC tablet 40 mg  40 mg Oral Daily Nehemiah Settle, MD   40 mg at 07/01/12 0836  . traZODone (DESYREL) tablet 100 mg  100 mg Oral QHS PRN,MR X 1 Himabindu Ravi, MD   100 mg at 06/30/12 2216  . venlafaxine XR (EFFEXOR-XR) 24 hr capsule 225 mg  225 mg Oral Q breakfast Himabindu Ravi, MD   225 mg at 07/01/12 5409    Lab Results:  No results found for this or any previous visit (from the past 48 hour(s)).  Physical Findings: AIMS: Facial and  Oral Movements Muscles of Facial Expression: None, normal Lips and Perioral Area: None, normal Jaw: None, normal Tongue: None, normal,Extremity Movements Upper (arms, wrists, hands, fingers): None, normal Lower (legs, knees, ankles, toes): None, normal, Trunk Movements Neck, shoulders, hips: None, normal, Overall Severity Severity of abnormal movements (highest score from questions above): None, normal Incapacitation due to abnormal movements: None, normal Patient's awareness of abnormal movements (rate only patient's report): No Awareness, Dental Status Current problems with teeth and/or dentures?: No Does patient usually wear dentures?: No  CIWA:  CIWA-Ar Total: 0  COWS:  COWS Total Score: 0   Treatment Plan Summary: Daily contact with patient to assess and evaluate symptoms and progress in treatment Medication management  Plan: Continue current plan of care. Plan for discharge tomorrow.  Medical Decision Making Problem Points:  Established problem, stable/improving (1), Review of last therapy session (1) and Review of psycho-social stressors (1) Data Points:  Review of medication regiment & side effects (2) Review of new medications or change in dosage (2)  I certify that inpatient services furnished can reasonably be expected to improve the patient's condition.   Elif Yonts,MICKIE D.RPA-C CAQ-Psych  07/01/2012, 4:47 PM

## 2012-07-01 NOTE — Clinical Social Work Psychosocial (Signed)
[facility] Group Notes:  (Clinical Social Work)  07/01/2012   3:00-4:00PM  Summary of Progress/Problems:   The main focus of today's process group was for the patient to identify ways in which they have in the past sabotaged their own recovery and to discuss their motivation to change, as well as confidence that they can change. Cognitive Behavioral Therapy concepts about the interconnectedness of thoughts/feelings/actions was introduced.  The patient expressed some understanding of topic as we first began to discuss, but then he left without explanation and did not return.  Type of Therapy:  Group Therapy - Process  Participation Level:  Minimal  Participation Quality:  Inattentive  Affect:  Blunted and Depressed  Cognitive:  Appropriate  Insight:  Lacking  Engagement in Therapy:  Lacking  Modes of Intervention:  Clarification, Education, Limit-setting, Problem-solving, Socialization, Support and Processing, Exploration, Discussion   Ambrose Mantle, LCSW 07/01/2012, 5:57 PM

## 2012-07-01 NOTE — Progress Notes (Signed)
Adult Psychoeducational Group Note  Date:  07/01/2012 Time:  2000  Group Topic/Focus:  AA  Participation Level:  Active  Participation Quality:    Affect:    Cognitive:    Insight:   Engagement in Group:    Modes of Intervention:    Additional Comments:  Pt attended AA group on the 300 hall this evening.   Ricardo Robbins A 07/01/2012, 4:42 AM

## 2012-07-01 NOTE — Progress Notes (Signed)
Adult Psychoeducational Group Note  Date:  07/01/2012 Time:  0900  Group Topic/Focus:  Coping With Mental Health Crisis:   The purpose of this group is to help patients identify strategies for coping with mental health crisis.  Group discusses possible causes of crisis and ways to manage them effectively.  Participation Level:  Did Not Attend  Participation Quality:    Affect:    Cognitive:    Insight:   Engagement in Group:    Modes of Intervention:    Additional Comments:    Arturo Morton 07/01/2012, 10:20 AM

## 2012-07-01 NOTE — Progress Notes (Signed)
Adult Psychoeducational Group Note  Date:  07/01/2012 Time:  1315  Group Topic/Focus:  Emotional Education:   The focus of this group is to discuss what feelings/emotions are, and how they are experienced.  Participation Level:  Active  Participation Quality:  Appropriate, Sharing and Supportive  Affect:  Appropriate  Cognitive:  Alert  Insight: Appropriate  Engagement in Group:  Engaged and Supportive  Modes of Intervention:  Activity, Discussion, Education, Exploration, Role-play and Support  Additional Comments:    Arturo Morton 07/01/2012, 3:12 PM

## 2012-07-01 NOTE — Progress Notes (Signed)
Writer spoke with patient who c/o congestion and requested nebulizer tx which he received. Patient attended group this evening and participated. Patient concerned if he is discharging on tomorrow because he reports that he has an interview next week he needs to be at. Patient offered support and encouraged patient to speak with doctor concerning his discharge so he can arrange for transportation. Patient currently denies si/hi/a/v hallucinations. Safety maintained on unit, will continue to monitor.

## 2012-07-02 MED ORDER — TRAZODONE HCL 100 MG PO TABS: 100.0000 mg | ORAL_TABLET | Freq: Every evening | ORAL | Status: DC | PRN

## 2012-07-02 MED ORDER — FLUTICASONE PROPIONATE 50 MCG/ACT NA SUSP: 2.0000 | Freq: Every day | NASAL | Status: DC

## 2012-07-02 MED ORDER — VENLAFAXINE HCL ER 75 MG PO CP24: 225.0000 mg | ORAL_CAPSULE | Freq: Every day | ORAL | Status: DC
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
## 2012-07-02 NOTE — [facility] Suicide Risk Assessment (Signed)
Suicide Risk Assessment  Discharge Assessment     Demographic factors:  Assessment Details Time of Assessment: Admission Information Obtained From: Patient Current Mental Status:  Current Mental Status: Suicidal ideation indicated by patient Risk Reduction Factors:  Risk Reduction Factors: Sense of responsibility to family;Positive therapeutic relationship  CLINICAL FACTORS:   Depression:   Anhedonia Hopelessness  COGNITIVE FEATURES THAT CONTRIBUTE TO RISK:  Loss of executive function  Pt states that he has a Bipolar Disorder and occasional manic states  SUICIDE RISK:   Minimal: No identifiable suicidal ideation.  Patients presenting with no risk factors but with morbid ruminations; may be classified as minimal risk based on the severity of the depressive symptoms He has medications for depression and to stabilize mood.  He is cautioned to seek medical evaluation to control any episode of mania.  PLAN OF CARE:  Ricardo Robbins 07/02/2012, 4:17 PM

## 2012-07-02 NOTE — Progress Notes (Signed)
Pt discharged at this time, pt denies SI/HI, pt provided with discharge instructions and pt verbalized understanding, prescriptions provided, all belongings returned.

## 2012-07-02 NOTE — Progress Notes (Signed)
The patient attended the A. A. Meeting on the 300 hallway. 

## 2012-07-05 NOTE — Progress Notes (Signed)
Patient Discharge Instructions:  After Visit Summary (AVS):   Faxed to:  07/05/12 Psychiatric Admission Assessment Note:   Faxed to:  07/05/12 Suicide Risk Assessment - Discharge Assessment:   Faxed to:  07/05/12 Faxed/Sent to the Next Level Care provider:  07/05/12 Faxed to [facility] @ [phone] Faxed to [facility] @ [phone] Jerelene Redden, 07/05/2012, 2:55 PM

## 2012-07-10 NOTE — Discharge Summary (Signed)
Physician Discharge Summary Note  Patient:  Ricardo Robbins is an 48 y.o., male MRN:  [id number] DOB:  1965/07/03 Patient phone:  [phone] (home)  Patient address:   [phone] [address] [place] [address]   Date of Admission:  06/28/2012 Date of Discharge: 07/02/2012  Discharge Diagnoses: Active Problems:  * No active hospital problems. *   Axis Diagnosis: AXIS I: Bipolar, Depressed  AXIS II: Deferred  AXIS III:  Past Medical History   Diagnosis  Date   .  ADHD (attention deficit hyperactivity disorder)    .  Bipolar 1 disorder      "I get some ups but I don't get downs"   .  OCD (obsessive compulsive disorder)    .  Complication of anesthesia      nausea/vomitting   .  Bipolar 1 disorder  06/05/2012   .  OCD (obsessive compulsive disorder)  06/05/2012   .  Duodenal ulcer disease     AXIS IV: economic problems, occupational problems and other psychosocial or environmental problems  AXIS V: 61-70  Reason for admission: Ricardo Robbins is an 47 y.o. male that presented to [facility] due to increasing si thoughts w/o a plan.Pt was referred by Dr. Evelene Croon who has been treating his bipolar disorder, and also prescribes him Adderall. Pt reports suffering with severe symptoms of depression, sadness, tearfulness, disturbed sleep, appetite, psychological pain, remorse regarding family, abandonment. Pt reports that he has been living in an RV for the last one month. Patient has a previous episode of mania or hypomanic symptoms. Pt denies HI, AV, psychosis. Pt reports "I am in a down swing." Pt reports that "I cry all day long and I am becoming unproductive." Pt confirms that if he still had life insurance he would kill himself. Pt has a hx of stressors that are mostly family and job related.      Level of Care:  Inpatient Hospitalization.  Hospital Course:   The patient attended treatment team meeting this am and met with treatment team members. The patient's symptoms, treatment plan and response  to treatment was discussed. The patient endorsed that their symptoms have improved. The patient also stated that they felt stable for discharge.  They reported that from this hospital stay they had learned many coping skills.  In other to maintain their psychiatric stability, they will continue psychiatric care on an outpatient basis. They will follow-up as outlined below.  In addition they were instructed  to take all your medications as prescribed by their mental healthcare provider and to report any adverse effects and or reactions from your medicines to their outpatient provider promptly.  The patient is also instructed and cautioned to not engage in alcohol and or illegal drug use while on prescription medicines.  In the event of worsening symptoms the patient is instructed to call the crisis hotline, 911 and or go to the nearest ED for appropriate evaluation and treatment of symptoms.   Also while a patient in this hospital, the patient received medication management for his psychiatric symptoms. They were ordered and received as outlined below:    Medication List     As of 07/10/2012  2:37 PM    STOP taking these medications         ALPRAZolam 1 MG tablet   Commonly known as: XANAX      amphetamine-dextroamphetamine 20 MG tablet   Commonly known as: ADDERALL      VICKS DAYQUIL COUGH 15 MG/15ML Liqd  Generic drug: Dextromethorphan HBr      TAKE these medications      Indication    acetaminophen 325 MG tablet   Commonly known as: TYLENOL   Take 2 tablets (650 mg total) by mouth every 4 (four) hours as needed.       amoxicillin 500 MG capsule   Commonly known as: AMOXIL   Take 500 mg by mouth 3 (three) times daily. For 7 days only. Start date 06/26/12       fluticasone 50 MCG/ACT nasal spray   Commonly known as: FLONASE   Place 2 sprays into the nose daily.       LamoTRIgine 100 MG Tb24   Take 1 tablet by mouth daily.       multivitamins with iron Tabs   Take 1 tablet by  mouth daily.       pantoprazole 40 MG tablet   Commonly known as: PROTONIX   Take 40 mg by mouth daily. You may use over the counter Prilosec 40 mg twice a day in place of this drug.       traMADol 50 MG tablet   Commonly known as: ULTRAM   Take 1 tablet (50 mg total) by mouth every 6 (six) hours as needed for pain.       traZODone 100 MG tablet   Commonly known as: DESYREL   Take 1 tablet (100 mg total) by mouth at bedtime as needed and may repeat dose one time if needed for sleep.       venlafaxine XR 150 MG 24 hr capsule   Commonly known as: EFFEXOR-XR   Take 1 capsule (150 mg total) by mouth daily with breakfast.       venlafaxine XR 75 MG 24 hr capsule   Commonly known as: EFFEXOR-XR   Take 3 capsules (225 mg total) by mouth daily with breakfast.        They were also enrolled in group counseling sessions and activities in which they participated actively.       Follow-up Information    [facility] up with [facility]. On 07/03/2012. (You are scheduled with [facility] for counseling on Tuesday, July 04, 2012 at 11:00 AM)    Contact information:   [address] [place], [place]   [address]  [phone]      Follow up with Dr. Evelene Croon. On 07/20/2012. (You are are scheduled with Dr. Evelene Croon on Thursday, July 21, 2011 at 5:30 PM)    Contact information:   [address] [place], [place]   [address]  [phone]         Upon discharge, patient adamantly denies suicidal, homicidal ideations, auditory, visual hallucinations and or delusional thinking. They left [facility] with all personal belongings via personal transportation in no apparent distress.  Consults:  Please see electronic medical record for details.  Significant Diagnostic Studies:  Please see electronic medical record for details.  Discharge Vitals:   Blood pressure 104/53, pulse 56, temperature 98.4 F (36.9 C), temperature source Oral, resp. rate 18, height 6\' 1"  (1.854 m),  weight 99.338 kg (219 lb)..  Mental Status Exam: See Mental Status Examination and Suicide Risk Assessment completed by Attending Physician prior to discharge.  Discharge destination:  Home  Is patient on multiple antipsychotic therapies at discharge:  No  Has Patient had three or more failed trials of antipsychotic monotherapy by history: N/A Recommended Plan for Multiple Antipsychotic Therapies: N/A    Medication List  As of 07/10/2012  2:37 PM    STOP taking these medications         ALPRAZolam 1 MG tablet   Commonly known as: XANAX      amphetamine-dextroamphetamine 20 MG tablet   Commonly known as: ADDERALL      VICKS DAYQUIL COUGH 15 MG/15ML Liqd   Generic drug: Dextromethorphan HBr      TAKE these medications      Indication    acetaminophen 325 MG tablet   Commonly known as: TYLENOL   Take 2 tablets (650 mg total) by mouth every 4 (four) hours as needed.       amoxicillin 500 MG capsule   Commonly known as: AMOXIL   Take 500 mg by mouth 3 (three) times daily. For 7 days only. Start date 06/26/12       fluticasone 50 MCG/ACT nasal spray   Commonly known as: FLONASE   Place 2 sprays into the nose daily.       LamoTRIgine 100 MG Tb24   Take 1 tablet by mouth daily.       multivitamins with iron Tabs   Take 1 tablet by mouth daily.       pantoprazole 40 MG tablet   Commonly known as: PROTONIX   Take 40 mg by mouth daily. You may use over the counter Prilosec 40 mg twice a day in place of this drug.       traMADol 50 MG tablet   Commonly known as: ULTRAM   Take 1 tablet (50 mg total) by mouth every 6 (six) hours as needed for pain.       traZODone 100 MG tablet   Commonly known as: DESYREL   Take 1 tablet (100 mg total) by mouth at bedtime as needed and may repeat dose one time if needed for sleep.       venlafaxine XR 150 MG 24 hr capsule   Commonly known as: EFFEXOR-XR   Take 1 capsule (150 mg total) by mouth daily with breakfast.       venlafaxine  XR 75 MG 24 hr capsule   Commonly known as: EFFEXOR-XR   Take 3 capsules (225 mg total) by mouth daily with breakfast.            Follow-up Information    [facility] up with [facility]. On 07/03/2012. (You are scheduled with [facility] for counseling on Tuesday, July 04, 2012 at 11:00 AM)    Contact information:   [address] [place], [place]   [address]  [phone]      Follow up with Dr. Evelene Croon. On 07/20/2012. (You are are scheduled with Dr. Evelene Croon on Thursday, July 21, 2011 at 5:30 PM)    Contact information:   [address] [place], [place]   [address]  [phone]        Follow-up recommendations:   Activities: Resume typical activities Diet: Resume typical diet Other: Follow up with outpatient provider and report any side effects to out patient prescriber.  Comments:  Take all your medications as prescribed by your mental healthcare provider. Report any adverse effects and or reactions from your medicines to your outpatient provider promptly. Patient is instructed and cautioned to not engage in alcohol and or illegal drug use while on prescription medicines. In the event of worsening symptoms, patient is instructed to call the crisis hotline, 911 and or go to the nearest ED for appropriate evaluation and treatment of symptoms. Follow-up  with your primary care provider for your other medical issues, concerns and or health care needs.  SignedPatrick North 07/10/2012 2:37 PM

## 2012-07-22 ENCOUNTER — Other Ambulatory Visit: Payer: Self-pay

## 2012-08-23 ENCOUNTER — Encounter (HOSPITAL_COMMUNITY): Payer: Self-pay

## 2012-08-23 ENCOUNTER — Emergency Department (INDEPENDENT_AMBULATORY_CARE_PROVIDER_SITE_OTHER)
Admission: EM | Admit: 2012-08-23 | Discharge: 2012-08-23 | Disposition: A | Discharge status: Home / Self Care | Payer: Self-pay | Source: Home / Self Care

## 2012-08-23 DIAGNOSIS — K922 Gastrointestinal hemorrhage, unspecified: Secondary | ICD-10-CM

## 2012-08-23 MED ORDER — PANTOPRAZOLE SODIUM 40 MG PO TBEC: 40.0000 mg | DELAYED_RELEASE_TABLET | Freq: Every day | ORAL | Status: DC

## 2012-08-23 MED ORDER — FLUTICASONE PROPIONATE 50 MCG/ACT NA SUSP: 2.0000 | Freq: Every day | NASAL | Status: DC

## 2012-08-23 NOTE — ED Notes (Signed)
Patient Demographics  Ricardo Robbins, is a 47 y.o. male  ZOX:[id number]  [id number]:[id number]  DOB - November 18, 1965  Chief Complaint  Patient presents with  . Follow-up        Subjective:   Ricardo Robbins history of bipolar disorder who was recently admitted for upper GI bleed few weeks ago, here to establish care and to get [facility] card. Has no subjective complaints. No headache chest pain, no abdominal pain, stool is brown, no blood or melena, no weakness fatigue.  Objective:    Filed Vitals:   08/23/12 1055  BP: 144/68  Pulse: 70  Temp: 97.4 F (36.3 C)  TempSrc: Oral  SpO2: 98%     Exam  Awake Alert, Oriented X 3, No new F.N deficits, Normal affect [name].AT,PERRAL Supple Neck,No JVD, No cervical lymphadenopathy appriciated.  Symmetrical Chest wall movement, Good air movement bilaterally, CTAB RRR,No Gallops,Rubs or new Murmurs, No Parasternal Heave +ve B.Sounds, Abd Soft, Non tender, No organomegaly appriciated, No rebound - guarding or rigidity. 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    Data Review   CBC No results found for this basename: WBC, HGB, HCT, PLT, MCV, MCH, MCHC, RDW, NEUTRABS, LYMPHSABS, MONOABS, EOSABS, BASOSABS, BANDABS, BANDSABD,  in the last 168 hours  Chemistries   No results found for this basename: NA, K, CL, CO2, GLUCOSE, BUN, CREATININE, GFRCGP, CALCIUM, MG, AST, ALT, ALKPHOS, BILITOT,  in the last 168 hours ------------------------------------------------------------------------------------------------------------------ No results found for this basename: HGBA1C,  in the last 72 hours ------------------------------------------------------------------------------------------------------------------ No results found for this basename: CHOL, HDL, LDLCALC, TRIG, CHOLHDL, LDLDIRECT,  in the last 72  hours ------------------------------------------------------------------------------------------------------------------ No results found for this basename: TSH, T4TOTAL, FREET3, T3FREE, THYROIDAB,  in the last 72 hours ------------------------------------------------------------------------------------------------------------------ No results found for this basename: VITAMINB12, FOLATE, FERRITIN, TIBC, IRON, RETICCTPCT,  in the last 72 hours  Coagulation profile  No results found for this basename: INR, PROTIME,  in the last 168 hours     Prior to Admission medications   Medication Sig Start Date End Date Taking? Authorizing Provider  acetaminophen (TYLENOL) 325 MG tablet Take 2 tablets (650 mg total) by mouth every 4 (four) hours as needed. 06/05/12   Sherrie George, PA-C  fluticasone [facility]) 50 MCG/ACT nasal spray Place 2 sprays into the nose daily. 08/23/12   Leroy Sea, MD  LamoTRIgine 100 MG TB24 Take 1 tablet by mouth daily.    Doristine Mango, PA-C  Multiple Vitamins-Iron (MULTIVITAMINS WITH IRON) TABS Take 1 tablet by mouth daily. 06/05/12   Sherrie George, PA-C  pantoprazole (PROTONIX) 40 MG tablet Take 1 tablet (40 mg total) by mouth daily. You may use over the counter Prilosec 40 mg twice a day in place of this drug. Can use the cheapest PPI 08/23/12   Leroy Sea, MD  traMADol (ULTRAM) 50 MG tablet Take 1 tablet (50 mg total) by mouth every 6 (six) hours as needed for pain. 06/16/12   Rodolph Bong, MD  traZODone (DESYREL) 100 MG tablet Take 1 tablet (100 mg total) by mouth at bedtime as needed and may repeat dose one time if needed for sleep. 07/02/12   Jorje Guild, PA-C  venlafaxine XR (EFFEXOR-XR) 150 MG 24 hr capsule Take 1 capsule (150 mg total) by mouth daily with breakfast. 12/28/11 12/27/12  Doristine Mango, PA-C  venlafaxine XR (EFFEXOR-XR) 75 MG 24 hr capsule Take 3 capsules (225 mg total) by mouth daily  with breakfast. 07/02/12   Jorje Guild, PA-C      Assessment & Plan   History of upper GI bleed. Patient taking PPI, no history of melena or blood in stool, feels fine no subjective complaints. We'll get him back in 4 weeks we'll check a CBC BMP a day prior to next visit, have recommended an outpatient followup with the GI physician.   History of bipolar disorder to continue following with a psychiatrist recommended to followup in a week. No medications changed here.   Alcohol and smoking. Has quit both. 3 counseled to continue abstaining.    Follow-up Information   Follow up with Stan Head, MD. Schedule an appointment as soon as possible for a visit in 1 week. (get blood work a day before)    Contact information:   [address] [place] [address] [phone]       Follow up with this clinic. Schedule an appointment as soon as possible for a visit in 1 month.       Leroy Sea M.D on 08/23/2012 at 11:11 AM   Leroy Sea, MD 08/23/12 1113

## 2012-08-23 NOTE — ED Notes (Signed)
Follow up-history of ulcers Has been seen at mental health

## 2012-09-04 ENCOUNTER — Encounter (HOSPITAL_BASED_OUTPATIENT_CLINIC_OR_DEPARTMENT_OTHER): Payer: Self-pay | Admitting: *Deleted

## 2012-09-04 ENCOUNTER — Emergency Department (HOSPITAL_BASED_OUTPATIENT_CLINIC_OR_DEPARTMENT_OTHER)
Admission: EM | Admit: 2012-09-04 | Discharge: 2012-09-04 | Disposition: A | Discharge status: Home / Self Care | Payer: Self-pay | Attending: Emergency Medicine | Admitting: Emergency Medicine

## 2012-09-04 DIAGNOSIS — Z8614 Personal history of Methicillin resistant Staphylococcus aureus infection: Secondary | ICD-10-CM | POA: Insufficient documentation

## 2012-09-04 DIAGNOSIS — H05019 Cellulitis of unspecified orbit: Secondary | ICD-10-CM | POA: Insufficient documentation

## 2012-09-04 DIAGNOSIS — Z79899 Other long term (current) drug therapy: Secondary | ICD-10-CM | POA: Insufficient documentation

## 2012-09-04 DIAGNOSIS — F429 Obsessive-compulsive disorder, unspecified: Secondary | ICD-10-CM | POA: Insufficient documentation

## 2012-09-04 DIAGNOSIS — Z8719 Personal history of other diseases of the digestive system: Secondary | ICD-10-CM | POA: Insufficient documentation

## 2012-09-04 DIAGNOSIS — F319 Bipolar disorder, unspecified: Secondary | ICD-10-CM | POA: Insufficient documentation

## 2012-09-04 DIAGNOSIS — Z8659 Personal history of other mental and behavioral disorders: Secondary | ICD-10-CM | POA: Insufficient documentation

## 2012-09-04 MED ORDER — DOXYCYCLINE HYCLATE 100 MG PO TABS
100.0000 mg | ORAL_TABLET | Freq: Once | ORAL | Status: AC
  Administered 2012-09-04: 100 mg via ORAL
  Filled 2012-09-04: qty 1

## 2012-09-04 MED ORDER — CEPHALEXIN 250 MG PO CAPS
1000.0000 mg | ORAL_CAPSULE | Freq: Once | ORAL | Status: AC
  Administered 2012-09-04: 1000 mg via ORAL
  Filled 2012-09-04: qty 4

## 2012-09-04 MED ORDER — DOXYCYCLINE HYCLATE 100 MG PO CAPS: 100.0000 mg | ORAL_CAPSULE | Freq: Two times a day (BID) | ORAL | Status: DC

## 2012-09-04 MED ORDER — CEPHALEXIN 500 MG PO CAPS: 500.0000 mg | ORAL_CAPSULE | Freq: Four times a day (QID) | ORAL | Status: DC
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
## 2012-09-04 NOTE — ED Notes (Signed)
Pt reports that he had a sore in the (R) side of his nose close to his eye.  Reports that the sore was draining into his eye and his eye was 'goopy'.  Denies change in vision, denies pain.

## 2013-04-12 ENCOUNTER — Other Ambulatory Visit: Payer: Self-pay

## 2014-02-11 ENCOUNTER — Emergency Department (HOSPITAL_BASED_OUTPATIENT_CLINIC_OR_DEPARTMENT_OTHER)
Admission: EM | Admit: 2014-02-11 | Discharge: 2014-02-12 | Disposition: A | Discharge status: Home / Self Care | Payer: Managed Care, Other (non HMO) | Attending: Emergency Medicine | Admitting: Emergency Medicine

## 2014-02-11 ENCOUNTER — Encounter (HOSPITAL_BASED_OUTPATIENT_CLINIC_OR_DEPARTMENT_OTHER): Payer: Self-pay | Admitting: Emergency Medicine

## 2014-02-11 DIAGNOSIS — R5383 Other fatigue: Secondary | ICD-10-CM | POA: Diagnosis not present

## 2014-02-11 DIAGNOSIS — Z792 Long term (current) use of antibiotics: Secondary | ICD-10-CM | POA: Diagnosis not present

## 2014-02-11 DIAGNOSIS — IMO0002 Reserved for concepts with insufficient information to code with codable children: Secondary | ICD-10-CM | POA: Insufficient documentation

## 2014-02-11 DIAGNOSIS — K625 Hemorrhage of anus and rectum: Secondary | ICD-10-CM | POA: Diagnosis present

## 2014-02-11 DIAGNOSIS — R55 Syncope and collapse: Secondary | ICD-10-CM | POA: Diagnosis not present

## 2014-02-11 DIAGNOSIS — F319 Bipolar disorder, unspecified: Secondary | ICD-10-CM | POA: Diagnosis not present

## 2014-02-11 DIAGNOSIS — R5381 Other malaise: Secondary | ICD-10-CM | POA: Insufficient documentation

## 2014-02-11 DIAGNOSIS — R42 Dizziness and giddiness: Secondary | ICD-10-CM | POA: Diagnosis not present

## 2014-02-11 DIAGNOSIS — Z8619 Personal history of other infectious and parasitic diseases: Secondary | ICD-10-CM | POA: Diagnosis not present

## 2014-02-11 DIAGNOSIS — R531 Weakness: Secondary | ICD-10-CM

## 2014-02-11 DIAGNOSIS — Z8719 Personal history of other diseases of the digestive system: Secondary | ICD-10-CM | POA: Diagnosis not present

## 2014-02-11 LAB — CBC WITH DIFFERENTIAL/PLATELET
BASOS ABS: 0 10*3/uL (ref 0.0–0.1)
Basophils Relative: 0 % (ref 0–1)
EOS ABS: 0.4 10*3/uL (ref 0.0–0.7)
EOS PCT: 5 % (ref 0–5)
HEMATOCRIT: 44.5 % (ref 39.0–52.0)
Hemoglobin: 14.9 g/dL (ref 13.0–17.0)
Lymphocytes Relative: 24 % (ref 12–46)
Lymphs Abs: 1.8 10*3/uL (ref 0.7–4.0)
MCH: 31 pg (ref 26.0–34.0)
MCHC: 33.5 g/dL (ref 30.0–36.0)
MCV: 92.7 fL (ref 78.0–100.0)
MONO ABS: 1 10*3/uL (ref 0.1–1.0)
Monocytes Relative: 14 % — ABNORMAL HIGH (ref 3–12)
Neutro Abs: 4.1 10*3/uL (ref 1.7–7.7)
Neutrophils Relative %: 57 % (ref 43–77)
PLATELETS: 225 10*3/uL (ref 150–400)
RBC: 4.8 MIL/uL (ref 4.22–5.81)
RDW: 12.8 % (ref 11.5–15.5)
WBC: 7.2 10*3/uL (ref 4.0–10.5)

## 2014-02-11 LAB — BASIC METABOLIC PANEL
ANION GAP: 13 (ref 5–15)
BUN: 13 mg/dL (ref 6–23)
CALCIUM: 8.9 mg/dL (ref 8.4–10.5)
CO2: 28 mEq/L (ref 19–32)
CREATININE: 1.2 mg/dL (ref 0.50–1.35)
Chloride: 100 mEq/L (ref 96–112)
GFR calc Af Amer: 81 mL/min — ABNORMAL LOW (ref >90)
GFR, EST NON AFRICAN AMERICAN: 70 mL/min — AB (ref >90)
GLUCOSE: 64 mg/dL — AB (ref 70–99)
Potassium: 3.5 mEq/L — ABNORMAL LOW (ref 3.7–5.3)
SODIUM: 141 meq/L (ref 137–147)

## 2014-02-11 LAB — OCCULT BLOOD X 1 CARD TO LAB, STOOL: FECAL OCCULT BLD: NEGATIVE

## 2014-02-11 NOTE — ED Notes (Signed)
PT presents to ED with complaints of rectal bleeding for 3 weeks and worse today.
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
## 2014-02-11 NOTE — ED Notes (Signed)
MD at bedside. 

## 2014-02-12 NOTE — Discharge Instructions (Signed)
Your tests showed no sign of any significant blood loss, and your rectal exam showed no signs of bleeding. Ear tested not give a reason for why you have been feeling so weak. Please followup with your PCP for additional testing.  Weakness Weakness is a lack of strength. It may be felt all over the body (generalized) or in one specific part of the body (focal). Some causes of weakness can be serious. You may need further medical evaluation, especially if you are elderly or you have a history of immunosuppression (such as chemotherapy or HIV), kidney disease, heart disease, or diabetes. CAUSES  Weakness can be caused by many different things, including:  Infection.  Physical exhaustion.  Internal bleeding or other blood loss that results in a lack of red blood cells (anemia).  Dehydration. This cause is more common in elderly people.  Side effects or electrolyte abnormalities from medicines, such as pain medicines or sedatives.  Emotional distress, anxiety, or depression.  Circulation problems, especially severe peripheral arterial disease.  Heart disease, such as rapid atrial fibrillation, bradycardia, or heart failure.  Nervous system disorders, such as Guillain-Barr syndrome, multiple sclerosis, or stroke. DIAGNOSIS  To find the cause of your weakness, your caregiver will take your history and perform a physical exam. Lab tests or X-rays may also be ordered, if needed. TREATMENT  Treatment of weakness depends on the cause of your symptoms and can vary greatly. HOME CARE INSTRUCTIONS   Rest as needed.  Eat a well-balanced diet.  Try to get some exercise every day.  Only take over-the-counter or prescription medicines as directed by your caregiver. SEEK MEDICAL CARE IF:   Your weakness seems to be getting worse or spreads to other parts of your body.  You develop new aches or pains. SEEK IMMEDIATE MEDICAL CARE IF:   You cannot perform your normal daily activities, such as  getting dressed and feeding yourself.  You cannot walk up and down stairs, or you feel exhausted when you do so.  You have shortness of breath or chest pain.  You have difficulty moving parts of your body.  You have weakness in only one area of the body or on only one side of the body.  You have a fever.  You have trouble speaking or swallowing.  You cannot control your bladder or bowel movements.  You have black or bloody vomit or stools. MAKE SURE YOU:  Understand these instructions.  Will watch your condition.  Will get help right away if you are not doing well or get worse. Document Released: 05/24/2005 Document Revised: 11/23/2011 Document Reviewed: 07/23/2011 [facility] Patient Information 2015 [place], [place]. This information is not intended to replace advice given to you by your health care provider. Make sure you discuss any questions you have with your health care provider.

## 2014-03-16 IMAGING — CR DG ABDOMEN ACUTE W/ 1V CHEST
3 series · 3 of 3 positions shown · non-contrast
Comparison: 05/03/2009

CLINICAL DATA: Acute abdominal pain

ACUTE ABDOMEN SERIES (ABDOMEN 2 VIEW & CHEST 1 VIEW)

[w chest pa]
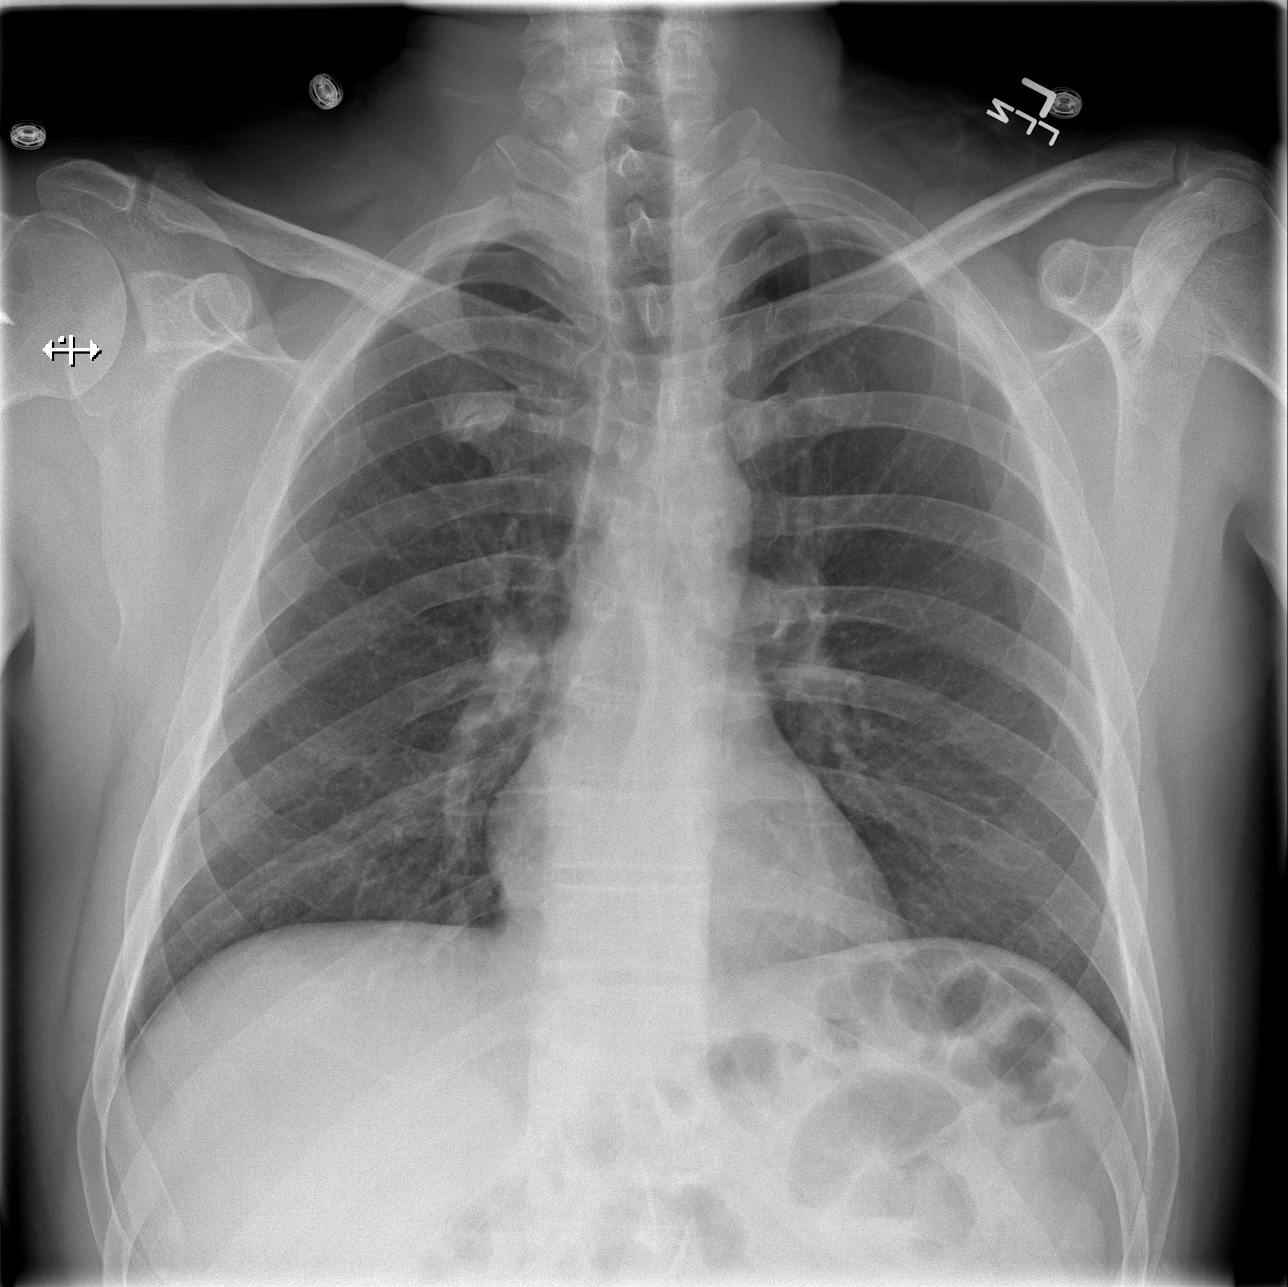

[t abdomen supine]
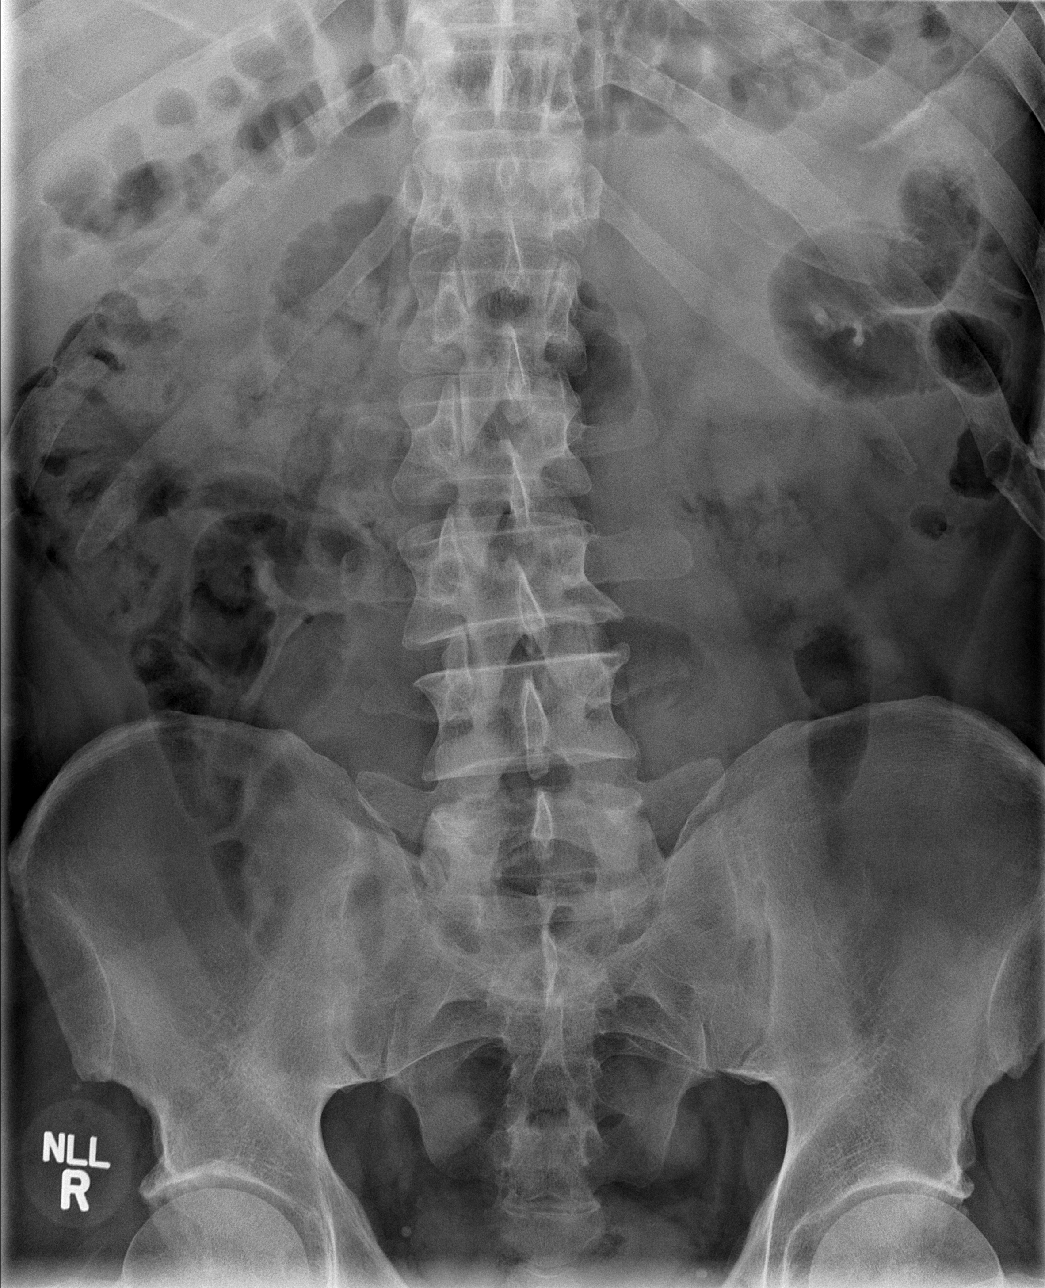

[x abdomen decub]
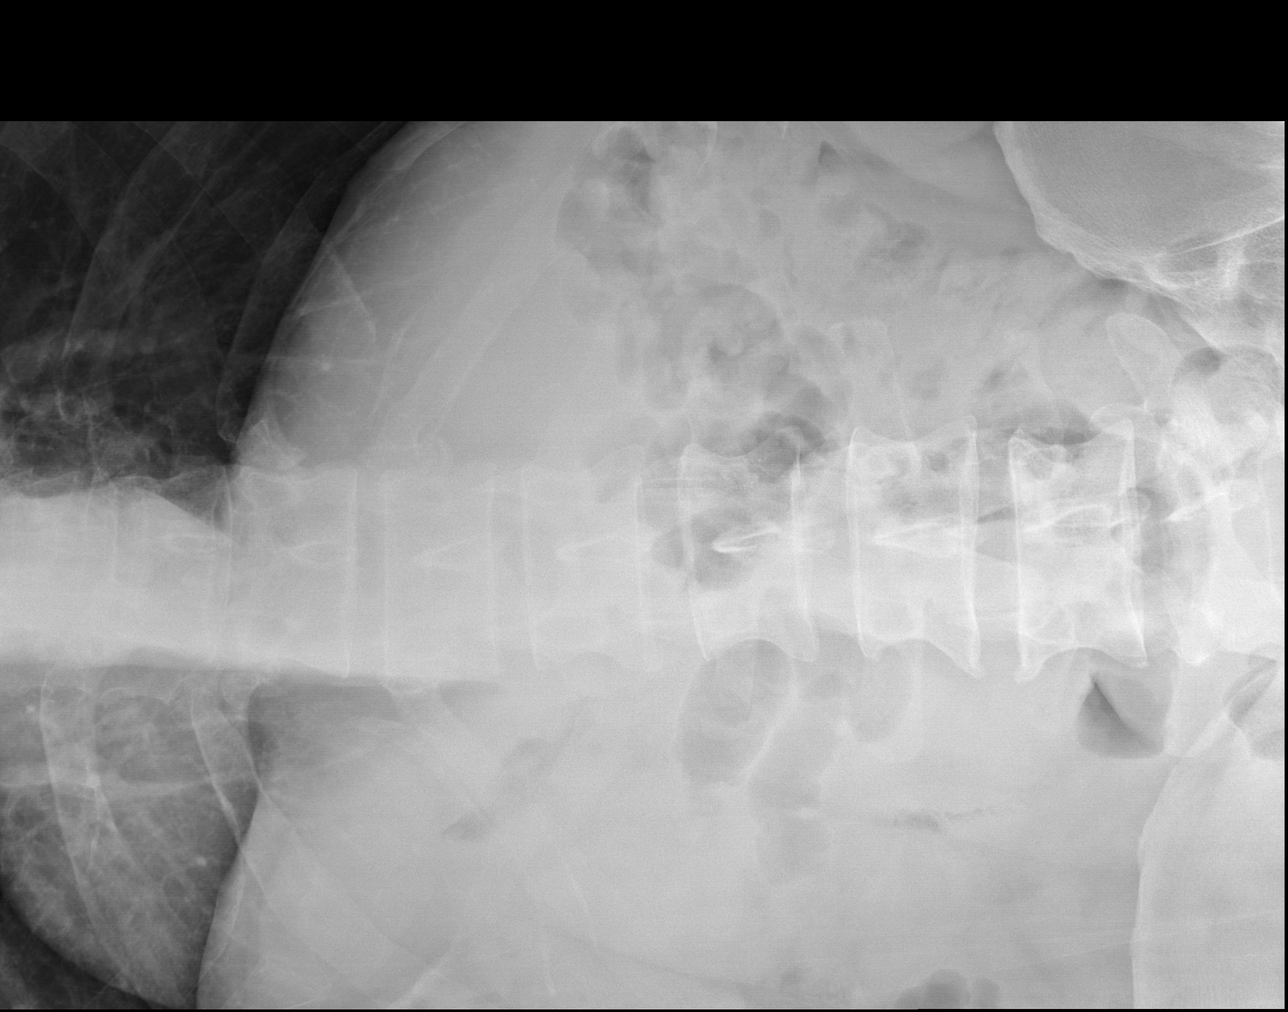

[3 of 3 positions shown; findings below may reference images not displayed]

FINDINGS: Normal heart size.  No pleural effusion or edema.  No
airspace consolidation identified.  Bowel gas pattern appears
nonspecific.  There are a few air-filled loops of small bowel
measuring up to 2.7 cm.  Gas and stool noted within normal-caliber
colon.  No air-fluid levels identified.
IMPRESSION: 1.  No active cardiopulmonary abnormalities.
2.   Nonspecific bowel gas pattern

## 2014-03-17 IMAGING — CT CT ABD-PELV W/ CM
2 of 5 series · 16 of 46 positions shown, 18 images · IV contrast (APPLIED)
Comparison: Abdominal radiograph performed 06/02/2012

CLINICAL DATA: Generalized abdominal pain, nausea and bloody
diarrhea.

CT ABDOMEN AND PELVIS WITH CONTRAST
TECHNIQUE: Multidetector CT imaging of the abdomen and pelvis was
performed following the standard protocol during bolus
administration of intravenous contrast.
Contrast: 100mL OMNIPAQUE IOHEXOL 300 MG/ML  SOLN

[Series 2: abd/pelv with 5.0 b31f st · axial · 0.77mm/px · z∈[-561,-136]mm · 13 of 95 slices shown, 15 images]
[im 5/95  soft-tissue]
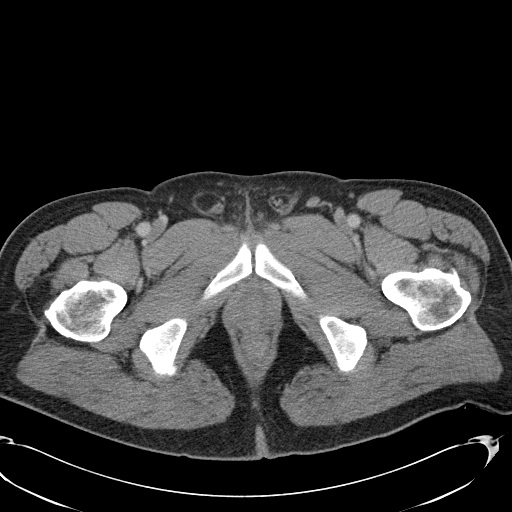
[im 5/95  bone]
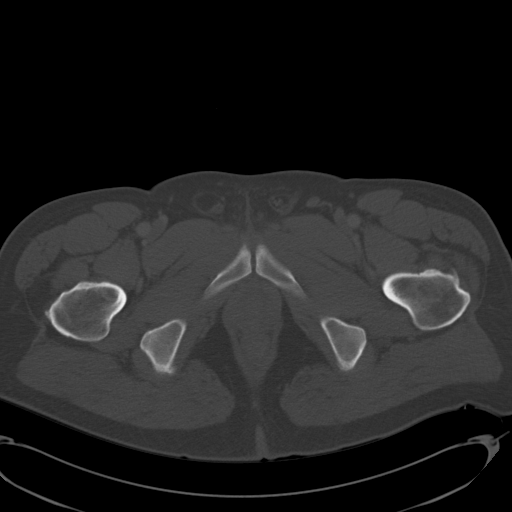
[im 15/95  soft-tissue]
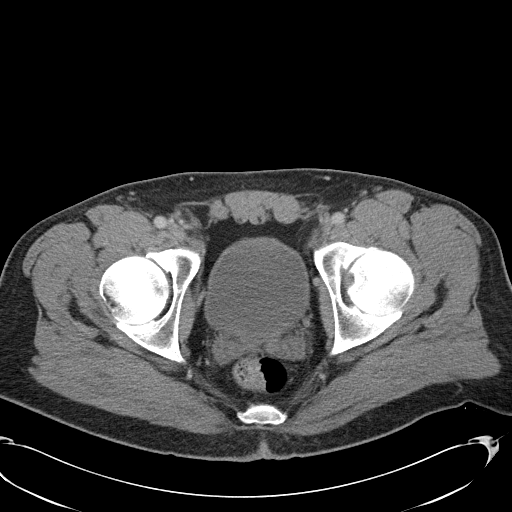
[im 20/95  soft-tissue]
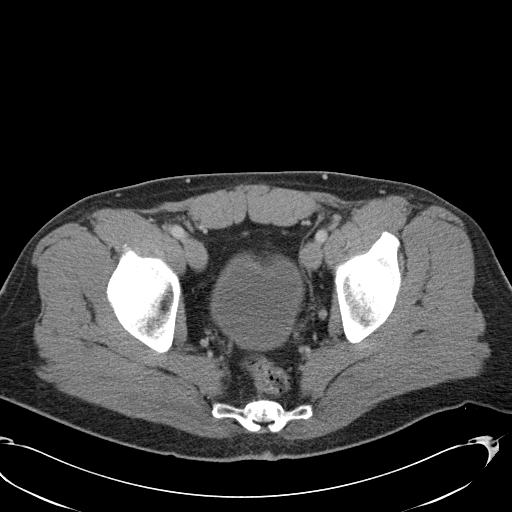
[im 25/95  soft-tissue]
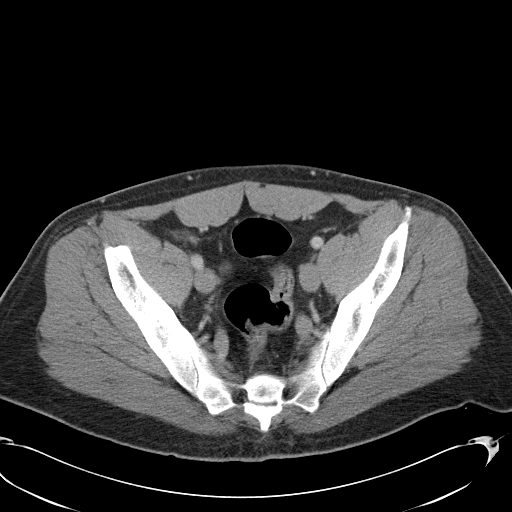
[im 35/95  soft-tissue]
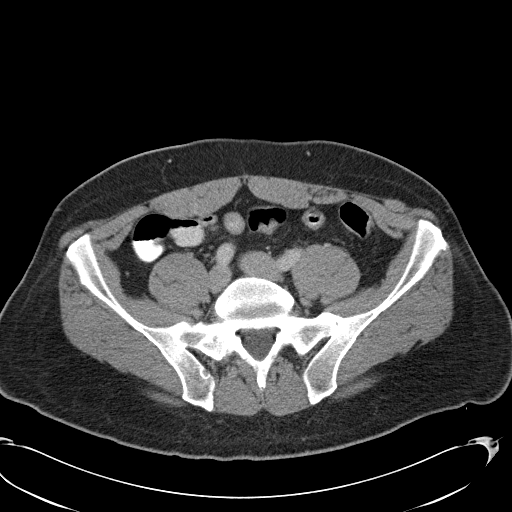
[im 40/95  soft-tissue]
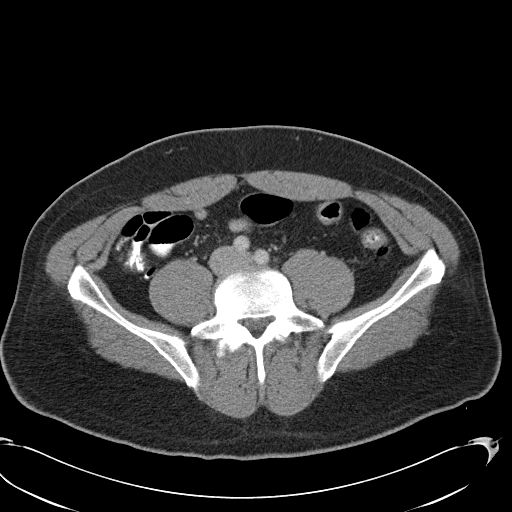
[im 50/95  soft-tissue]
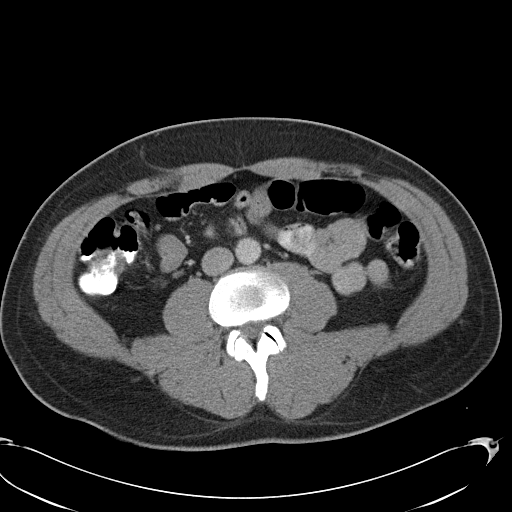
[im 55/95  soft-tissue]
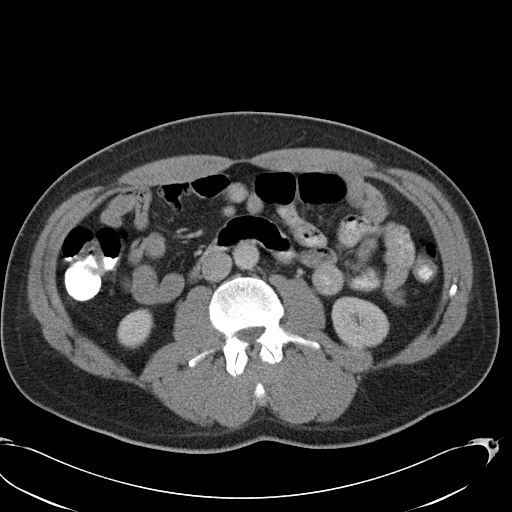
[im 60/95  soft-tissue]
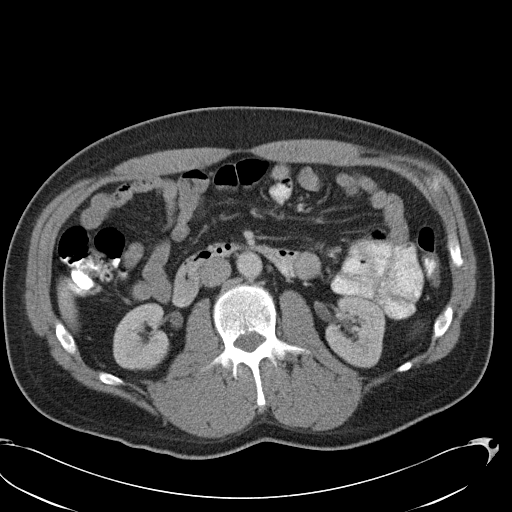
[im 60/95  bone]
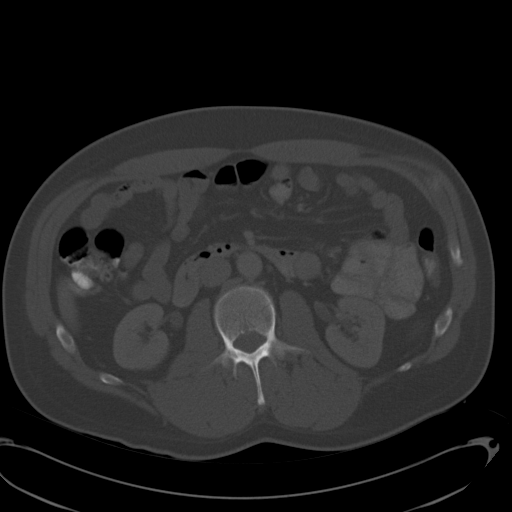
[im 70/95  soft-tissue]
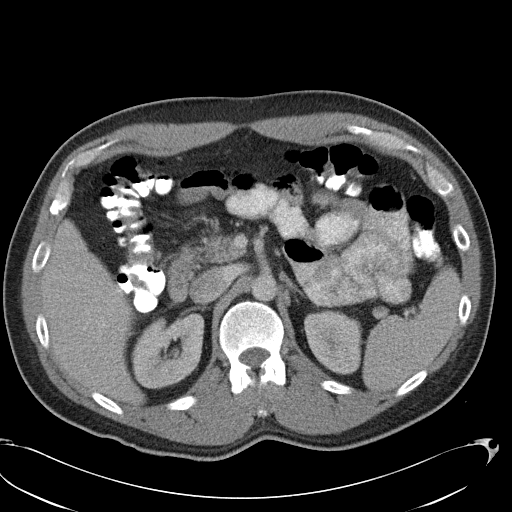
[im 75/95  soft-tissue]
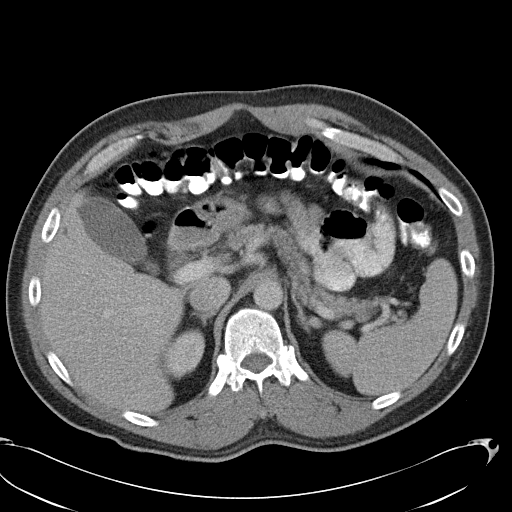
[im 80/95  soft-tissue]
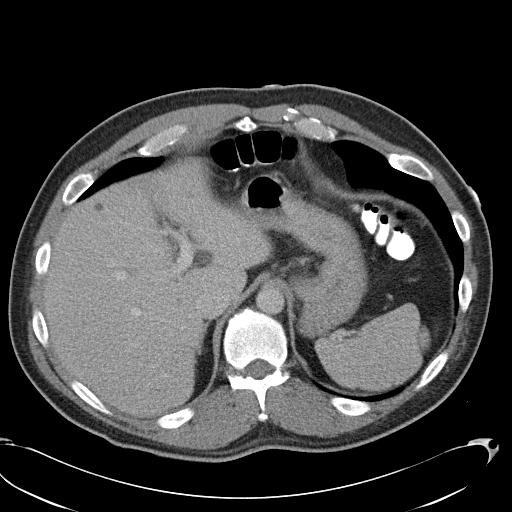
[im 90/95  soft-tissue]
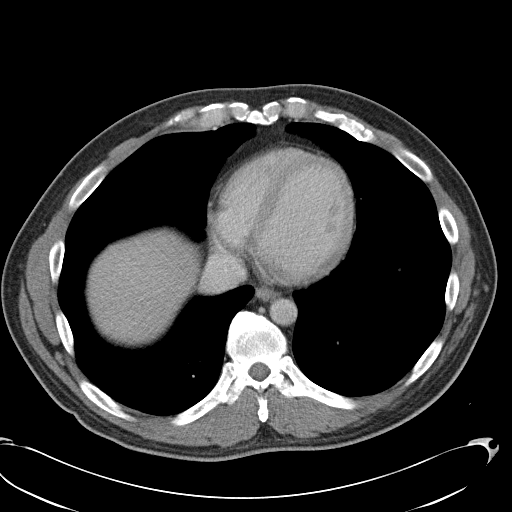

[Series 602: cor · coronal · 0.93mm/px · 3 of 126 slices shown]
[im 42/126  soft-tissue]
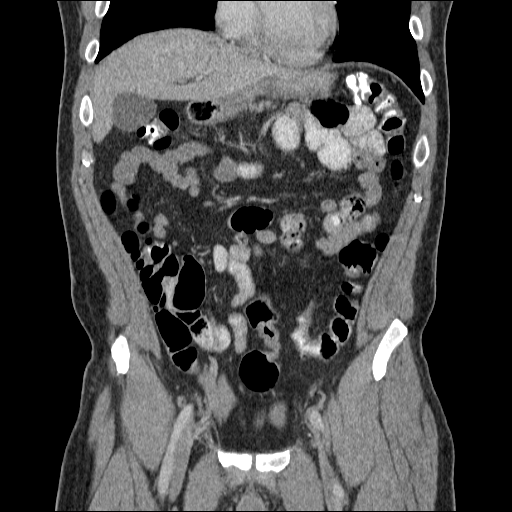
[im 56/126  soft-tissue]
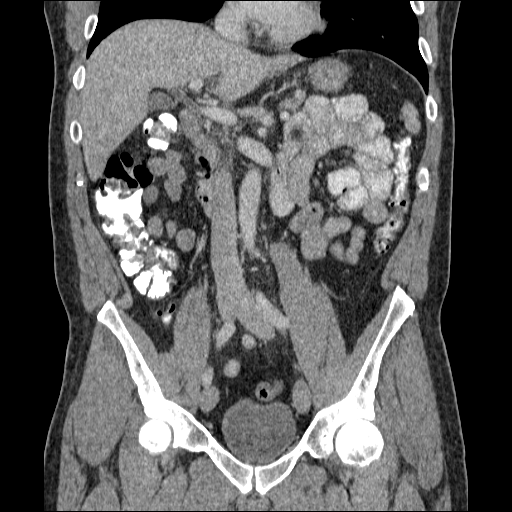
[im 70/126  soft-tissue]
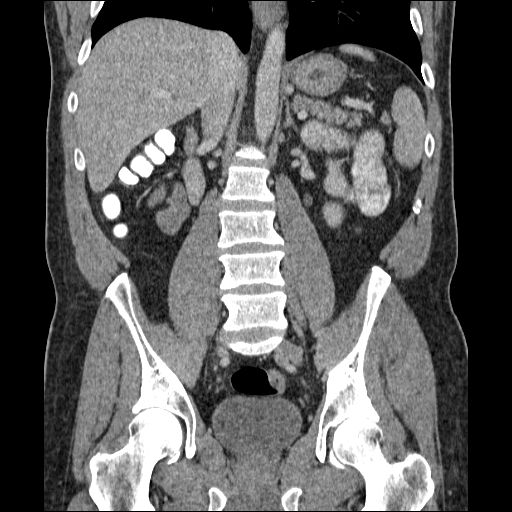

[16 of 46 positions shown; findings below may reference images not displayed]

FINDINGS: The visualized lung bases are clear.

Scattered hypodensities are noted within the liver, measuring up to
1.6 cm.  These are nonspecific.  The largest of these, seen near
the hepatic hilum at the left hepatic lobe, is slightly
heterogeneous; would correlate with LFTs and consider dynamic liver
protocol MRI or CT for further evaluation.

The liver is otherwise unremarkable in appearance.  The spleen is
within normal limits.  The gallbladder is unremarkable.  The
pancreas is grossly unremarkable in appearance.  The adrenal glands
are within normal limits.

The kidneys are unremarkable in appearance.  There is no evidence
of hydronephrosis.  No renal or ureteral stones are seen.  No
perinephric stranding is appreciated.

There is question of mild stranding and haziness about the second
segment of the duodenum, though this may be artifactual in nature;
would correlate for symptoms of mild duodenitis.  The remaining
small bowel is unremarkable in appearance.

No free fluid is identified.  The stomach is within normal limits.
No acute vascular abnormalities are seen.

The appendix is normal in caliber and contains air and contrast,
without evidence for appendicitis.  Contrast progresses to the
proximal sigmoid colon.  The colon is unremarkable in appearance.

The bladder is mildly distended and grossly unremarkable.  The
prostate remains normal in size.  A small right inguinal hernia is
seen, containing only fat.  No inguinal lymphadenopathy is seen.

No acute osseous abnormalities are identified.
IMPRESSION: 1.  Question of mild stranding and haziness about the second
segment of the duodenum, though this may be artifactual in nature.
Would correlate for symptoms of mild duodenitis, and follow-up with
labs to exclude very mild pancreatitis.
2.  Small right inguinal hernia, containing only fat.
3.  Scattered hypodensities within the liver, measuring up to
cm in size.  The largest of these, seen near the hepatic hilum at
the left hepatic lobe, is slightly heterogeneous; would correlate
with LFTs and consider dynamic liver protocol MRI or CT for further
evaluation, on an elective non-emergent basis.

## 2015-07-15 ENCOUNTER — Ambulatory Visit (INDEPENDENT_AMBULATORY_CARE_PROVIDER_SITE_OTHER): Payer: Managed Care, Other (non HMO)

## 2015-07-18 ENCOUNTER — Ambulatory Visit (INDEPENDENT_AMBULATORY_CARE_PROVIDER_SITE_OTHER): Payer: Managed Care, Other (non HMO) | Admitting: Physician Assistant

## 2015-07-18 ENCOUNTER — Telehealth: Payer: Self-pay | Admitting: *Deleted

## 2015-07-18 VITALS — BP 144/84 | HR 48 | Temp 98.0°F | Resp 17 | Ht 74.5 in | Wt 235.0 lb

## 2015-07-18 DIAGNOSIS — F319 Bipolar disorder, unspecified: Secondary | ICD-10-CM

## 2015-07-18 DIAGNOSIS — G8929 Other chronic pain: Secondary | ICD-10-CM | POA: Diagnosis not present

## 2015-07-18 DIAGNOSIS — F429 Obsessive-compulsive disorder, unspecified: Secondary | ICD-10-CM | POA: Diagnosis not present

## 2015-07-18 DIAGNOSIS — F909 Attention-deficit hyperactivity disorder, unspecified type: Secondary | ICD-10-CM

## 2015-07-18 DIAGNOSIS — F1120 Opioid dependence, uncomplicated: Secondary | ICD-10-CM | POA: Insufficient documentation

## 2015-07-18 MED ORDER — ALPRAZOLAM 1 MG PO TABS: ORAL_TABLET | ORAL | Status: DC

## 2015-07-18 NOTE — Progress Notes (Signed)
Ricardo Robbins  MRN: [id number] DOB: 07-16-1965  Subjective:  Pt presents to clinic for a medication discussion.  He has been on his current medications for at least 2 years.  He has been living in [facility] and recently moved back to [facility] for a job with [employer].  He is currently in a methadone (  qd) clinic ([facility] for [facility]) because he has been on Oxy for chronic pain for a long time and he wants to get off the Oxycodone so he been going there for that.  They would like him to get off the xanax and they recommend him going on Klonopin and then titrating off of that.  He was started on Xanax to keep his anxiety lower and he takes nightly and then about 2-3x a week he takes one in the afternoon on high anxiety days.  He has found that when his anxiety is really bad he cycles into mania.  He has been out of his Xanax and he has been taking his wife because he does not want to stop it abruptly.  He has been unable to get an appt locally and he would like to see Dr Evelene Croon again.  He has an appt with his [facility] psychiatrist on 2/28 that he plans to go to if he can not find a local one sooner.  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 Chronic pain 07/18/2015  . 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 Bipolar 1 disorder (HCC) 06/05/2012  . OCD (obsessive compulsive disorder) 06/05/2012  . ADHD (attention deficit hyperactivity disorder) 06/05/2012  . GI bleed 06/03/2012    Current Outpatient Prescriptions on File Prior to Visit  Medication Sig Dispense Refill  . acetaminophen (TYLENOL) 325 MG tablet Take 2 tablets (650 mg total) by mouth every 4 (four) hours as needed.    Marland Kitchen amphetamine-dextroamphetamine (ADDERALL XR) 30 MG 24 hr capsule Take 30 mg by mouth daily.    . divalproex (DEPAKOTE) 250 MG DR tablet Take 250 mg by mouth 3 (three) times daily.    . Multiple Vitamins-Iron (MULTIVITAMINS WITH IRON) TABS Take 1  tablet by mouth daily.    Marland Kitchen venlafaxine XR (EFFEXOR-XR) 150 MG 24 hr capsule Take 1 capsule (150 mg total) by mouth daily with breakfast. 30 capsule 6   No current facility-administered medications on file prior to visit.    Allergies  Allergen Reactions  . Zofran [Ondansetron] Hives    Review of Systems Objective:  BP 144/84 mmHg  Pulse 48  Temp(Src) 98 F (36.7 C) (Oral)  Resp 17  Ht 6' 2.5" (1.892 m)  Wt 235 lb (106.595 kg)  BMI 29.78 kg/m2  SpO2 96%  Physical Exam  Constitutional: He is oriented to person, place, and time and well-developed, well-nourished, and in no distress.  HENT:  Head: Normocephalic and atraumatic.  Right Ear: External ear normal.  Left Ear: External ear normal.  Eyes: Conjunctivae are normal.  Neck: Normal range of motion.  Pulmonary/Chest: Effort normal.  Neurological: He is alert and oriented to person, place, and time. Gait normal.  Skin: Skin is warm and dry.  Psychiatric: Memory, affect and judgment normal. His mood appears anxious.    Assessment and Plan :  Chronic pain  Methadone maintenance therapy patient (HCC)  Bipolar 1 disorder (HCC) - Plan: ALPRAZolam (XANAX) 1 MG tablet  OCD (obsessive compulsive disorder)  Attention deficit hyperactivity disorder (ADHD), unspecified  ADHD type   D/w pt that I will speak to Dr Evelene Croon about getting him an appt but if he is unable to get one he will need to f/u in [facility] until his appt with her.  I will give him a 3 week refill of his medication to get to at least his [place] appt but I will not be able to fill for long periods of time.  I am also uncomfortable with changing his medication and would prefer a psychiatrist to do that.  He understands and agrees with the plan.  Spoke with Sherian Maroon at Dr Carie Caddy office - he will need a new patient visit - he should call and make that appt.  Due to the length of time for the next available appt in May he will need to f/u with [facility] and get medications from them until  he is seen by Dr Evelene Croon - this information was left on his voicemail.  Benny Lennert PA-C  [facility] 07/18/2015 5:37 PM

## 2015-07-18 NOTE — Patient Instructions (Addendum)
I will talk to Dr Evelene Croon about getting a sooner appt that her next 1st available new patient appt in May.  If she is able to get you in sooner you can cancel your appt in [facility].  If she is unable to see you in the next month you will need to go to [facility] for your FU appt.

## 2015-07-18 NOTE — Telephone Encounter (Signed)
Called patient with no answer. Left message on voicemail on behalf of Ricardo Robbins for him to make an appointment with Dr. Philis Fendt and see him as a new patient. Also he needs to go to [place] and see his psychiatrist.

## 2015-08-08 ENCOUNTER — Ambulatory Visit (INDEPENDENT_AMBULATORY_CARE_PROVIDER_SITE_OTHER): Payer: Managed Care, Other (non HMO) | Admitting: Physician Assistant

## 2015-08-08 ENCOUNTER — Other Ambulatory Visit: Payer: Self-pay | Admitting: Physician Assistant

## 2015-08-08 VITALS — BP 152/82 | HR 59 | Temp 97.8°F | Resp 18 | Ht 74.0 in | Wt 237.0 lb

## 2015-08-08 DIAGNOSIS — F909 Attention-deficit hyperactivity disorder, unspecified type: Secondary | ICD-10-CM

## 2015-08-08 DIAGNOSIS — R030 Elevated blood-pressure reading, without diagnosis of hypertension: Secondary | ICD-10-CM | POA: Diagnosis not present

## 2015-08-08 DIAGNOSIS — F429 Obsessive-compulsive disorder, unspecified: Secondary | ICD-10-CM | POA: Diagnosis not present

## 2015-08-08 DIAGNOSIS — F319 Bipolar disorder, unspecified: Secondary | ICD-10-CM

## 2015-08-08 DIAGNOSIS — IMO0001 Reserved for inherently not codable concepts without codable children: Secondary | ICD-10-CM

## 2015-08-08 MED ORDER — ALPRAZOLAM 1 MG PO TABS: ORAL_TABLET | ORAL | Status: AC

## 2015-08-08 MED ORDER — ALPRAZOLAM 1 MG PO TABS: ORAL_TABLET | ORAL | Status: DC

## 2015-08-08 MED ORDER — AMPHETAMINE-DEXTROAMPHETAMINE 20 MG PO TABS: 40.0000 mg | ORAL_TABLET | Freq: Two times a day (BID) | ORAL | Status: DC

## 2015-08-08 MED ORDER — AMPHETAMINE-DEXTROAMPHETAMINE 20 MG PO TABS: 40.0000 mg | ORAL_TABLET | Freq: Two times a day (BID) | ORAL | Status: AC

## 2015-08-08 NOTE — Progress Notes (Signed)
Ricardo Robbins  MRN: [id number] DOB: 06/09/1965  Subjective:  Pt presents to clinic for medication refill.  He went to [facility] to the new psychiatrist but she was unwilling to write him medication and dosages and she sent him away with nothing. He is not sure what he is supposed to do.  He has not yet made an appt with Dr Evelene CroonKaur locally.    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  Over the last week he has used half his dose of adderall and he has noticed a significant change in his attention but he is worried because in the past this has triggered some mania.  He feels desperate - he is not sure what to do.  Patient Active Problem List   Diagnosis Date Noted  . Chronic pain 07/18/2015  . 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 Bipolar 1 disorder (HCC) 06/05/2012  . OCD (obsessive compulsive disorder) 06/05/2012  . ADHD (attention deficit hyperactivity disorder) 06/05/2012  . GI bleed 06/03/2012    Current Outpatient Prescriptions on File Prior to Visit  Medication Sig Dispense Refill  . divalproex (DEPAKOTE) 250 MG DR tablet Take 250 mg by mouth 3 (three) times daily.    . Multiple Vitamins-Iron (MULTIVITAMINS WITH IRON) TABS Take 1 tablet by mouth daily.    Marland Kitchen. acetaminophen (TYLENOL) 325 MG tablet Take 2 tablets (650 mg total) by mouth every 4 (four) hours as needed. (Patient not taking: Reported on 08/08/2015)    . venlafaxine XR (EFFEXOR-XR) 150 MG 24 hr capsule Take 1 capsule (150 mg total) by mouth daily with breakfast. 30 capsule 6   No current facility-administered medications on file prior to visit.    Allergies  Allergen Reactions  . Zofran [Ondansetron] Hives    Review of Systems Objective:  BP  152/82 mmHg  Pulse 59  Temp(Src) 97.8 F (36.6 C) (Oral)  Resp 18  Ht 6\' 2"  (1.88 m)  Wt 237 lb (107.502 kg)  BMI 30.42 kg/m2  SpO2 98%  Physical Exam  Constitutional: He is oriented to person, place, and time and well-developed, well-nourished, and in no distress.  HENT:  Head: Normocephalic and atraumatic.  Right Ear: External ear normal.  Left Ear: External ear normal.  Eyes: Conjunctivae are normal.  Neck: Normal range of motion.  Pulmonary/Chest: Effort normal.  Neurological: He is alert and oriented to person, place, and time. Gait normal.  Skin: Skin is warm and dry.  Psychiatric: Mood, memory, affect and judgment normal.  Speech is slightly pressured - pt appears anxious    Assessment and Plan :  Bipolar 1 disorder (HCC) - Plan:  ALPRAZolam (XANAX) 1 MG tablet  Attention deficit hyperactivity disorder (ADHD), unspecified ADHD type - Plan: Care order/instruction: amphetamine-dextroamphetamine (ADDERALL) 20 MG tablet  Elevated BP - pt is anxious - he needs to monitor his BP  OCD (obsessive compulsive disorder)  Had a lengthy discussion with patient and wife that they did hold up half their end of my deal - [facility] but he has not yet an appt with Dr Evelene CroonKaur.  Pt has been on this medication dose and he seems to be complaint with the medication  I gave him at the last visit.  He is in an unfortunate situation in that he is on high dose controlled substance and he does not currently have a psychiatrist to give him these medications.  I am willing to help this patient but he must hold up his end of the deal.  I have written these medications for him but he needs to determine a plan for the next several months - even if it is get an appt with Dr Evelene Croon and I will consider writing it until then as long as he sees me monthly.  Benny Lennert PA-C  [facility] 08/14/2015 12:55 PM

## 2015-08-08 NOTE — Patient Instructions (Signed)
I have given you a month supply - this is all I can give you  - you must have a plan in place for next month.

## 2015-08-20 ENCOUNTER — Telehealth: Payer: Self-pay | Admitting: Physician Assistant

## 2015-08-20 NOTE — Telephone Encounter (Signed)
[facility] [place] [facility] I filled out the form that I did know that the patient was in a methadone treatment center and the patient had told me he was receiving this medication.  I also noted that I would not be writing this medication long term as he should be finding a psychiatrist so in the future they will need to contact that provider.  Called and [facility] of [facility] a care coordinator regarding my fax back to their center.

## 2017-08-29 NOTE — Nursing Note (Signed)
Adult Patient History Form-Text       Adult Patient History Entered On:  08/29/2017 16:09 EDT    Performed On:  08/29/2017 16:07 EDT by Senaida Ores, RN, CHRISTAN M               General Info   Patient Identified :   Identification band, Verbal   Information Given By :   Self   Accompanied By :   None   Pregnancy Status :   N/A   Has the patient received chemotherapy or biotherapy within the last 48 hours? :   No   In Clinical Trial With Signed Consent for Related Condition :   No signed consent for clinical trial   Is the patient currently (2-3 days) receiving radiation treatment? :   No   RICHARDSON, RN, CHRISTAN M - 08/29/2017 16:07 EDT   Allergies   (As Of: 08/29/2017 16:09:05 EDT)   Allergies (Active)   No Known Allergies  Estimated Onset Date:   Unspecified ; Created By:   Mardella Layman, RN, Geanie Berlin; Reaction Status:   Active ; Category:   Drug ; Substance:   No Known Allergies ; Type:   Allergy ; Updated By:   Toni Arthurs; Reviewed Date:   08/29/2017 12:06 EDT        Procedure History        -    Procedure History   (As Of: 08/29/2017 16:09:05 EDT)     Anesthesia Minutes:   0 ; Procedure Name:   LEFT KNEE ARTHROSCOPY ; Procedure Minutes:   0 ; Comments:     08/17/2017 14:42 EDT - Maudie Flakes, RN, Tomma Rakers            Anesthesia Minutes:   0 ; Procedure Name:   RIGHT SHOULDER LABRAL REPAIR ; Procedure Minutes:   0            Anesthesia Minutes:   0 ; Procedure Name:   Tonsillectomy and adenoidectomy ; Procedure Minutes:   0            Anesthesia Minutes:   0 ; Procedure Name:   Colonoscopy ; Procedure Minutes:   0            Anesthesia Minutes:   0 ; Procedure Name:   RECTAL SURGERY ; Procedure Minutes:   0            Anesthesia Minutes:   0 ; Procedure Name:   brachial plexius right arm surgery ; Procedure Minutes:   0 ; Comments:     08/29/2017 15:29 EDT - JEFFCOAT, RN, CONSTANCE  3 surgeries  08/29/2017 15:28 EDT - JEFFCOAT, RN, CONSTANCE  w/ hardware-does not straighten            Procedure Dt/Tm:   08/29/2017 13:06:00 EDT  ; Location:   [facility] OR ; Provider:   [facility]; Anesthesia Type:   Spinal ; :   BOYER-MD,  Marlow Baars; Anesthesia Minutes:   0 ; Procedure Name:   Knee Arthroplasty Total SCIP (Left) ; Procedure Minutes:   102 ; Comments:     08/29/2017 15:03 EDT - Anner Crete, RN, RON D  auto-populated from documented surgical case ; Clinical Service:   Surgery ; Last Reviewed Dt/Tm:   08/29/2017 13:06:00 EDT            Immunizations   Influenza Vaccine Status :   Patient refused   Last Tetanus :   Less than 5 years  RICHARDSON, RN, CHRISTAN M - 08/29/2017 16:07 EDT   ID Risk Screen Symptoms   Recent Travel History :   No recent travel   TB Symptom Screen :   No symptoms   C. diff Symptom/History ID :   Neither of the above   Patient Pregnant :   None of the above   MRSA/VRE Screening :   None of these apply   CRE Screening :   Not applicable   Senaida Ores, RN, Conni Elliot M - 08/29/2017 16:07 EDT   Bloodless Medicine   Will Patient Accept Blood Transfusion and/or Blood Products :   Yes   RICHARDSON, RN, CHRISTAN M - 08/29/2017 16:07 EDT   Nutrition   Nutritional Risk Factors :   None   Unintentional Weight Change Greater Than 10 lbs in the Last 6 Months :   No   RICHARDSON, RN, Hassie Bruce - 08/29/2017 16:07 EDT   Functional   Sensory Deficits :   None   ADLs Prior to Admission :   Independent   Senaida Ores RN, Hassie Bruce - 08/29/2017 16:07 EDT   Social History   Social History   (As Of: 08/29/2017 16:09:06 EDT)   Tobacco:        Tobacco use: Never (less than 100 in lifetime).   (Last Updated: 08/29/2017 11:04:18 EDT by Kinnie Scales, RN, Belinda)          Alcohol:        Denies   (Last Updated: 08/17/2017 14:45:33 EDT by Maudie Flakes, RN, Neldon Newport)          Substance Abuse:        Past   (Last Updated: 08/18/2017 14:26:22 EDT by Maudie Flakes, RN, Neldon Newport)            Spiritual   Do you have a concern that you would like to address with a Chaplain? :   No   Do you have any religious/spiritual/cultural beliefs that could impact the way your care is provided? :    No   RICHARDSON, RN, Hassie Bruce - 08/29/2017 16:07 EDT   Harm Screen   Injuries/Abuse/Neglect in Household :   Denies   Feels Unsafe at Home :   No   Recent Life Events :   None   Family Behavioral Health History :   None   Previous Mental Illness Diagnosis :   None   Suicidal Behavior :   None   Self Harming Behavior :   None   Suicidal Ideation :   None   Senaida Ores, RN, Hassie Bruce - 08/29/2017 16:07 EDT   Advance Directive   Advance Directive :   No   RICHARDSON, RN, Hassie Bruce - 08/29/2017 16:07 EDT   Education   Written Language :   [place]   Primary Language :   Clarnce Flock, RN, Hassie Bruce - 08/29/2017 16:07 EDT   Caregiver/Advocate Language   Patient :   Verbal explanation   RICHARDSON, RN, Hassie Bruce - 08/29/2017 16:07 EDT   Barriers to Learning :   None evident   Teaching Method :   Demonstration, Explanation, Teach-back   Responsible Learner Present for Session :   Learta Codding, RN, CHRISTAN M - 08/29/2017 16:07 EDT   DCP GENERIC CODE   Unit/Room Orientation :   T[place]on Gammon understanding   Environmental Safety :   Verbalizes understanding   Hand Washing :   Verbalizes understanding   Infection Prevention :   Verbalizes understanding  DVT Prophylaxis :   Verbalizes understanding   Isolation Precaution :   Verbalizes understanding   RICHARDSON, RN, Hassie BruceCHRISTAN M - 08/29/2017 16:07 EDT   DC Needs   Living Situation :   Home independently   Anticipated Discharge Needs :   Home health   ConcowRICHARDSON, RN, Hassie BruceCHRISTAN M - 08/29/2017 16:07 EDT   Valuables and Belongings   Does Patient Have Valuables and Belongings :   Yes   RICHARDSON, RN, CHRISTAN M - 08/29/2017 16:07 EDT   DCP GENERIC CODE   At Bedside :   Clothes, Purse/Wallet, Cell phone   Secured Location :   Contact lenses   Senaida OresRICHARDSON, RN, Hassie BruceCHRISTAN M - 08/29/2017 16:07 EDT   Patient Search Completed :   Tyler PitaNA   RICHARDSON, RN, CHRISTAN M - 08/29/2017 16:07 EDT   Admission Complete   Admission Complete :   Yes   RICHARDSON, RN, CHRISTAN M - 08/29/2017 16:07 EDT

## 2017-08-29 NOTE — Case Communication (Signed)
Face to Face Encounter Document - Text       Face to Face Encounter Documentation Entered On:  08/29/2017 14:58 EDT    Performed On:  08/29/2017 14:57 EDT by Athena Masseooker,  Heyden Jaber W-PA-C               Face to Face Encounter   [facility] Cert[facility] Statement :   I had a face to face encounter with this patient that meets the provider face to face requirements, As the ordering provider, I attest to the evaluation, assessment, treatment and follow-up in relation to both the patient and to the home health services ordered.   Primary Reason for Home Health Care :   Functional limitations due to recent surgery   Medically Necessary Patient Services: :   Physical Therapy: To Evaluate, Treat and Manage Care of Related Diagnosis   Clinical Findings Support the need for Skill :   Decreased or limited mobility, range of motion, and/or functional status requiring therapy, Gait instability or difficulty walking requiring gait training and strengthening excercises   Additional Support for Homebound Status :   At risk for falls due to history of falls, limited/altered mobility, or medical condition, Pain impairs mobility; pain medication can impair decision making, Unsteady gait and poor balance requiring use of assistive device and supervision, Activity restrictions due to medical condition/surgery   Encounter Date :   08/29/2017 14:57 EDT   Athena MasseRooker,  Devell Parkerson W-PA-C - 08/29/2017 14:57 EDT

## 2017-08-29 NOTE — Nursing Note (Signed)
Medication Administration Follow Up-Text       Medication Administration Follow Up Entered On:  08/29/2017 22:43 EDT    Performed On:  08/29/2017 22:00 EDT by Wilford SportsBOULWARE, RN, SHARNICE R      Intervention Information:     hydromorphone  Performed by Wilford SportsBOULWARE, RN, SHARNICE R on 08/29/2017 21:45:00 EDT       hydromorphone,1mg   IV Push,Forearm, Mid Right,severe pain (8-10)       Med Response   ED Medication Response :   No adverse reaction, No change in symptoms   Numeric Rating Pain Scale :   9   Pasero Opioid Induced Sedation Scale :   1 = Awake and alert   BOULWARE, RN, SHARNICE R - 08/29/2017 22:42 EDT

## 2017-08-29 NOTE — Nursing Note (Signed)
Medication Administration Follow Up-Text       Medication Administration Follow Up Entered On:  08/29/2017 22:42 EDT    Performed On:  08/29/2017 22:42 EDT by Wilford SportsBOULWARE, RN, SHARNICE R      Intervention Information:     acetaminophen-hydrocodone  Performed by Wilford SportsBOULWARE, RN, SHARNICE R on 08/29/2017 20:45:00 EDT       HYDROcodone-acetaminophen,2tabs  Oral,moderate pain (4-7)       Med Response   ED Medication Response :   No change in symptoms   BOULWARE, RN, SHARNICE R - 08/29/2017 22:42 EDT

## 2017-08-30 NOTE — Procedures (Signed)
 IntraOp Record - [facility]             IntraOp Record - [facility] Summary                                                                   Primary Physician:        ANNIE ECK    Case Number:              [id number]    Finalized Date/Time:      08/30/17 18:13:02    Pt. Name:                 [name] Woods, [name] Woods    D.O.B./Sex:               Apr 25, 1966    Male    Med Rec #:                [id number]    Physician:                ANNIE ECK    Financial #:              [id number]    Pt. Type:                 I    Room/Bed:                 0677/01    Admit/Disch:              08/30/17 14:27:00 -    Institution:       [facility] - Case Times                                                                                                         Entry 1                                                                                                          Patient      In Room Time             08/29/17 12:27:00               Out Room Time                   08/29/17 14:52:00    Anesthesia     Procedure  Start Time               08/29/17 13:06:00               Stop Time                       08/29/17 14:48:00    Last Modified By:         MALVINA RN, RON D                              08/29/17 15:02:59      [facility] - Case Times Audit                                                                          08/29/17 15:02:59         Owner: [employer]                               Modifier: CLAY[employer]                                                        <+> 1         Out Room Time        <+> 1         Stop Time        [facility] - Case Attendance                                                                                                    Entry 1                         Entry 2                         Entry 3                                          Case Attendee             BOYER-MD,  ALEXIS               MERRILL-MD,  FRANCIS AKIN, RN, LAUREN M  [facility]    Role Performed             Anesthesiologist                Surgeon Primary                 Circulator Relief    Time In                   08/29/17 12:27:00               08/29/17 12:27:00               08/29/17 12:27:00    Time Out                  08/29/17 14:52:00               08/29/17 14:52:00               08/29/17 13:00:00    Procedure                 Knee Arthroplasty Total         Knee Arthroplasty Total         Knee Arthroplasty Total                              SCIP(Left)                      SCIP(Left)                      SCIP(Left)    Last Modified By:         MALVINA RN, RON JONETTA MALVINA, RN, RON JONETTA MALVINA, RN, RON D                              08/29/17 15:03:00               08/29/17 15:03:00               08/29/17 15:03:00                                Entry 4                         Entry 5                         Entry 6                                          Case Attendee             MALVINA, RN, RON D                ROOKER-PAC,  LAURAINE LELON Suzy Francis H    Role Performed            Circulator  First Assistant                 Surgical Scrub    Time In                   08/29/17 13:00:00               08/29/17 12:27:00               08/29/17 12:27:00    Time Out                  08/29/17 14:52:00               08/29/17 14:52:00               08/29/17 14:52:00    Procedure                 Knee Arthroplasty Total         Knee Arthroplasty Total         Knee Arthroplasty Total                              SCIP(Left)                      SCIP(Left)                      SCIP(Left)    Last Modified By:         MALVINA RN, RON JONETTA MALVINA, RN, RON JONETTA MALVINA, RN, RON D                              08/29/17 15:03:30               08/29/17 15:03:00               08/29/17 15:03:00                                Entry 7                                                                                                          Case Attendee              CLAUDIE SOR K    Role Performed            Surgical Scrub Relief    Time In                   08/29/17 12:27:00    Time Out                  08/29/17 14:52:00    Procedure  Knee Arthroplasty Total                              SCIP(Left)    Last Modified By:         MALVINA, RN, RON D                              08/29/17 15:03:00    General Comments:            LARRAINE PATTERSON STRYKER REP. SOPHIA TRAVON RESODENT ASSIST.      [facility] - Case Attendance Audit                                                                     08/29/17 15:03:30         Owner: [employer]                               Modifier: WELLRO                                                            4     <+> Role Performed            4     <*> Procedure                              Knee Arthroplasty Total SCIP(Left)     08/29/17 15:03:00         Owner: [employer]                               Modifier: WELLRO                                                            1     <+> Time Out            1     <*> Procedure                              Knee Arthroplasty Total SCIP(Left)            2     <+> Time Out            2     <*> Procedure                              Knee Arthroplasty Total SCIP(Left)            3     <*> Procedure  Knee Arthroplasty Total SCIP(Left)        <+> 4         Time Out        <+> 4         Procedure            5     <+> Time Out            5     <*> Procedure                              Knee Arthroplasty Total SCIP(Left)            6     <+> Time Out            6     <*> Procedure                              Knee Arthroplasty Total SCIP(Left)            7     <+> Time Out            7     <*> Procedure                              Knee Arthroplasty Total SCIP(Left)     08/29/17 14:41:17         Owner: [employer]                               Modifier: WELLRO                                                            1     <*> Procedure                              Knee Arthroplasty Total  SCIP(Left)            2     <*> Procedure                              Knee Arthroplasty Total SCIP(Left)            3     <+> Time In            3     <*> Procedure                              Knee Arthroplasty Total SCIP(Left)            5     <+> Time In            5     <*> Procedure                              Knee Arthroplasty Total SCIP(Left)  6     <+> Time In            6     <*> Procedure                              Knee Arthroplasty Total SCIP(Left)            7     <+> Time In            7     <*> Procedure                              Knee Arthroplasty Total SCIP(Left)     08/29/17 13:11:12         Owner: [employer]                               Modifier: WELLRO                                                            1     <*> Time In            1     <*> Procedure            1     <*> Procedure                              Knee Arthroplasty Total SCIP(Left)            1     <*> Procedure                              Knee Arthroplasty Total SCIP(Left)            2     <*> Time In            2     <*> Procedure            2     <*> Procedure                              Knee Arthroplasty Total SCIP(Left)            2     <*> Procedure                              Knee Arthroplasty Total SCIP(Left)            3     <*> Case Attendee                          WARRICK, RN, TINNIE HERO            3     <*> Role Performed            3     <*> Time Out  3     <*> Procedure            4     <*> Case Attendee                          WELLS, RN, RON D            4     <*> Time In            5     <*> Case Attendee                          GABE DOMINO W            5     <*> Role Performed            5     <*> Procedure            6     <*> Case Attendee                          Barnhill,  Tabatha H            6     <*> Role Performed            6     <*> Procedure            7     <*> Case Attendee                          KEELAN,  KELLY K            7     <*> Role Performed             7     <*> Procedure        [facility] - Skin Assessment                                                                          Pre-Care Text:            A.240 Assesses baseline skin condition Im.120 Implements protective measures to prevent skin or tissue injury           due to mechanical sources  Im.280.1 Implements progective measures to prevent skin or tissue injury due to           thermal sources Im.360 Monitors for signs and symptons of infection                              Entry 1  Skin Integrity            Intact    Last Modified By:         MALVINA, RN, RON D                              08/29/17 13:24:00    Post-Care Text:            E.10 Evaluates for signs and symptoms of physical injury to skin and tissue E.270 Evaluate tissue perfusion           O.60 Patient is free from signs and symptoms of injury caused by extraneous objects   O.210 Patinet's tissue           perfusion is consistent with or improved from baseline levels      [facility] - Patient Positioning                                                                      Pre-Care Text:            A.240 Assesses baseline skin condition A.280 Identifies baseline musculoskeletal status A.280.1 Identifies           physical alterations that require additional precautions for procedure-specific positioning A.510.8 Maintains           patient's dignity and privacy Im.120 Implements protective measures to prevent skin/tissue injury due to           mechanical sources Im.40 Positions the patient Im.80 Applies safety devices                              Entry 1                                                                                                          Procedure                 Knee Arthroplasty Total         Body Position                   Supine                              SCIP(Left)    Left Arm Position         Extended on Padded Arm           Right Arm Position              Extended on Padded Arm  Board w/Security Strap                                          Board w/Security Strap    Left Leg Position         Held on [place], Extended         Right Leg Position              Extended Security Strap    Feet Uncrossed            Yes                             Pressure Points                 Yes                                                              Checked     Positioning Device        Head Rest Foam, Foot            Positioned By                   BETTE ROUSE                              Stop, Safety Strap                                              GWENN, MERRILL-MD,                                                                                              KEITH, WHISENHUNT, RN,                                                                                              TINNIE HERO, ROOKER-PAC,  [facility]    Outcome Met (O.80)        Yes    Last Modified ByBETHA DIXONS, RN, RON D                              08/29/17 13:23:34    Post-Care Text:            A.240 Assesses baseline skin condition A.280 Identifies baseline musculoskeletal status A.280.1 Identifies           physical alterations that require additional precautions for procedure-specific positioning A.510.8 Maintains           patient's dignity and privacy Im.120 Implements protective measures to prevent skin/tissue injury due to           mechanical sources Im.40 Positions the patient Im.80 Applies safety devices      [facility] - Skin Prep                                                                                Pre-Care Text:            A.30 Verifies allergies A.20 Verifies procedure, surgical site, and laterality A.510.8 Maintains paritnet's           dignity and privacy Im.270 Performs Skin Preparation Im.270.1 Implements protective measures to prevent  skin           and tissue injury due to chemical sources  A.300.1 Protects from cross-contamination                              Entry 1                                                                                                          Hair Removal     Skin Prep      Prep Agents (Im.270)     Chlorhexidine Gluconate         Prep Area (Im.270)              Knee and Lower Leg                              2% w/Alcohol     Prep Area Details        Left                            Prep By  MERRILL-MD,  KEITH    Outcome Met (O.100)       Yes    Last Modified ByBETHA DIXONS, RN, RON D                              08/29/17 13:24:16    Post-Care Text:            E.10 Evaluates for signs and symptoms of physical injury to skin and tissue O.100 Patient is free from signs           and symptoms of chemical injury  O.740 The patient's right to privacy is maintained      [facility] - Counts Initial and Final                                                                 Pre-Care Text:            A.20.2 - Assesses the risk for unintended retained surgical items Im.20 - Performs required counts                              Entry 1                                                                                                          Initial Counts      Initial Counts           WHISENHUNT, RN, LAUREN          Items included in               Sponges, Sharps     Performed By             CHRISTELLA PERLA,  BURNARD K             the Initial Count     Final Counts      Final Counts             WELLS, RN, RON D,               Final Count Status              Correct     Performed By             Suzy Rhyme H     Items Included in        Sponges, Sharps     Final Count     Outcome Met (O.20)        Yes    Last Modified By:         DIXONS, RN, RON D  08/29/17 14:39:14    Post-Care Text:            E.50 - Evaluates results of the surgical count O.20 - Patient is free from unintended retained  surgical items      [facility] - Counts Initial and Final Audit                                                            08/29/17 14:39:14         Owner: [employer]                               Modifier: WELLRO                                                        <+> 1         Final Count Status        <+> 1         Items Included in Final Count        <+> 1         Outcome Met (O.20)        [facility] - General Case Data                                                                        Pre-Care Text:            A.350.1 Classifies surgical wound                              Entry 1                                                                                                          Case Information      ASA Class                2                               Case Level                      Level 4     OR  RH1 03                          Specialty                       Orthopedic (SN)     Wound Class              1-Clean    Preop Diagnosis           OA                              Postop Diagnosis                SAME    Last Modified By:         MALVINA, RN, RON D                              08/29/17 13:18:38    Post-Care Text:            O.760 Patient receives consistent and comparable care regardless of the setting      [facility] - Fire Risk Assessment                                                                                               Entry 1                                                                                                          Fire Risk                 Alcohol Based Prep              Fire Risk Score                 3    Assessment: If            Solution, Open Oxygen    checked, checkmark        Source, Ignition Source    = 1 point                 In Use    Last Modified By:         MALVINA, RN, RON D                              08/29/17 13:19:52      [facility] - Time Out  Pre-Care Text:             A.10 Confirms patient identity A.20 Verifies operative procedure, surgical site, and laterality A.20.1 Verifies           consent for planned procedure A.30 Verifies allergies                              Entry 1                                                                                                          Procedure                 Knee Arthroplasty Total         Is everyone ready               Yes                              SCIP(Left)                      to perform time out     Have all members of       Yes                             Patient name and                Yes    the surgical team                                         DOB confirmed     been introduced     Allergies discussed       Yes                             Surgical procedure              Yes                                                              to be performed                                                               confirmed and  verified by                                                               completed surgical                                                               consent     Correct surgical          Yes                             Correct laterality              Yes    site marked and                                           confirmed     initials are     visible through     prepped and draped     field (or     alternative ID band     used)     Correct patient           Yes                             Surgeon shares                  Yes    position confirmed                                        operative plan,                                                               possible                                                               difficulties,                                                               expected duration,  anticipated blood                                                                loss and reviews                                                               all                                                               critical/specific                                                               concerns     Required blood            Yes                             Essential imaging               Yes    products, implants,                                       available and fetal     devices and/or                                            heartones confirmed     special equipment                                         (if applicable)     available and     sterility confirmed     VTE prophylaxis           Yes                             Antibiotics ordered             Yes    addressed                                                 and administered     Anesthesia shares  Yes                             Fire risk                       Yes    anesthetic plan and                                       assessment scored     reviews patient                                           and plan discussed     specific concerns     Appropriate drying        Yes                             Surgeon states:                 Yes    time for prep                                             Does anyone have     observed before                                           any concerns? If     draping                                                   you see, suspect,                                                               or feel that                                                               patient care is                                                               being compromised,  speak up for                                                               patient safety     Time Out Complete         08/29/17 13:06:00    Last Modified By:         MALVINA, RN, RON D                               08/29/17 13:23:58    Post-Care Text:            E.30 Evaluates verification process for correct patient, site, side, and level surgery      [facility] - Time Out Audit                                                                            08/29/17 13:23:58         Owner: [employer]                               Modifier: CLAYBORN                                                        <+> 1         Procedure        <+> 1         Correct surgical site marked and                      initials are visible through                      prepped and draped field (or                      alternative ID band used)        <+> 1         Surgical procedure to be performed                      confirmed and verified by completed                      surgical consent        <+> 1         Surgeon states: Does anyone have                      any concerns? If you see, suspect,                      or feel that patient  care is being                      compromised, speak up for patient                      safety        <+> 1         Appropriate drying time for prep                      observed before draping        <+> 1         Fire risk assessment scored and                      plan discussed        <+> 1         Anesthesia shares anesthetic plan                      and reviews patient specific                      concerns        <+> 1         Antibiotics ordered and administered        <+> 1         VTE prophylaxis addressed        <+> 1         Essential imaging available and                      fetal heartones confirmed (if                      applicable)        <+> 1         Required blood products, implants,                      devices and/or special equipment                      available and sterility confirmed        <+> 1         Surgeon shares operative plan,                      possible difficulties, expected                      duration, anticipated blood loss                      and reviews all critical/specific                       concerns        <+> 1         Correct patient position confirmed        <+> 1         Correct laterality confirmed        <+> 1         Allergies discussed        <+> 1         Patient name and DOB confirmed        <+> 1  Have all members of the surgical                      team been introduced        <+> 1         Is everyone ready to perform time                      out        [facility] - Debrief                                                                                  Pre-Care Text:            Im.330 Manages specimen handling and disposition                              Entry 1                                                                                                          Procedure                 Knee Arthroplasty Total         Final counts                    Yes                              SCIP(Left)                      correct and                                                               verbally verified                                                               with  surgeon/licensed                                                               independent                                                               practitioner (if                                                               applicable)     Actual procedure          Yes                             Postop diagnosis                Yes    performed confirmed                                       confirmed     Wound                     Yes                             Confirm specimens               Yes    classification                                            and specimens     confirmed                                                 labeled                                                               appropriately (if                                                               applicable)  Foley catheter            Yes                              Patient recovery                Yes    removed (if                                               plan confirmed     applicable)     Debrief Complete          08/29/17 14:39:00    Last Modified By:         MALVINA, RN, RON D                              08/29/17 14:40:04    Post-Care Text:            E.800 Ensures continuity of care E.50 Evaluates results of the surgical count O.30 Patient's procedure is           performed on the correct site, side, and level O.50 patient's current status is communicated throughout the           continuum of care O.40 Patient's specimen(s) is managed in the appropriate manner      [facility] - Cautery                                                                                  Pre-Care Text:            A.240 Assesses baseline skin condition A280.1 Identifies baseline musculoskeletal status Im.50 Implements           protective measures to prevent injury due to electrical sources  Im.60 Uses supplies and equipment within safe           parameters Im.80 Applies safety devices                              Entry 1                                                                                                          ESU Type                  GENERATOR                       Identification  [id number]                              [facility]              Number     Coag Setting (watts)      60                              Cut Setting (watts)             60    Grounding Pad             Yes                             Grounding Pad Site              Thigh, left    Needed?     Grounding Pad             WELLS, RN, RON D                Outcome Met (O.10)              Yes    Applied By     Last Modified By:         MALVINA, RN, RON D                              08/29/17 14:39:00    Post-Care Text:            E.10 Evaluates for signs and symptoms of physical injury to skin and tissue O.10 Patient is free from signs and           symptoms of injury related to  thermal sources  O.70 Patient is free from signs and symptoms of electrical injury      RH[facility] - Patient Care Devices                                                                     Pre-Care Text:            A.200 Assesses risk for normothermia regulation A.40 Verifies presence of prosthetics or corrective devices           Im.280 Implements thermoregulation measures Im.60 Uses supplies and equipment within safe parameters                              Entry 1                         Entry 2  Equipment Type            BAIR HUGGER                     MACHINE SEQUENTIAL                                                              COMPRESSION    SCD Sleeve Site           Leg Right                       Leg Right    Equipment/Tag Number      [id number]                           [id number]    Initiated Pre     Induction     Last Modified By:         MALVINA RN, RON JONETTA MALVINA, RN, RON D                              08/29/17 13:22:53               08/29/17 13:22:53    Post-Care Text:            E.10 Evaluates signs and symptoms of physical injury to skin and tissue O.60 Patient is free from signs and           symptoms of injury caused by extraneous objects      RHOR - Tourniquet                                                                               Pre-Care Text:            A.20 Verifies procedure, surgical site, and laterality A.220.2 Identifies baseline tissue perfusion A.240           Assesses baseline skin condition A.40 Verifies presence of prosthetics or corrective devices Im.120 Implements           protective measures to prevent skin or tissue injury due to mechanical sources  Im.60 Uses supplies and           equipement within safe parameters Im.80 Applies safety devices                              Entry 1  Tourniquet Type            TOURNIQUET PNEUMATIC            Setting                         300 mmHg    Placement                 Leg Upper Left                  Padding (Im.120)                Yes    Tourniquet Times      Inflated                 08/29/17 13:07:00    Outcome Met (O.60)        Yes    Last Modified ByBETHA DIXONS, RN, RON D                              08/29/17 13:08:01    Post-Care Text:            E.10 Evaluates for signs and symptoms of physical injury to skin and tissue E. 270 Evaluates tissue perfusion           O.210 Patient's tissue perfusion is consisent with or improved from baseline levels O.30 Patient's procedure is           performed on the correct site, side, level O.60 Patient is free from sign and symptoms of injury caused by           extraneous objects      RHOR - Medications                                                                              Pre-Care Text:            A.10 Confirms patient identity A.30 Verifies allergies Im.220 Administers prescribed medications Im.220.2           Administers prescribed antibiotic therapy as ordered                              Entry 1                         Entry 2                                                                          Time Administered         08/29/17 13:20:00               08/29/17 13:22:00    Medication  BACITRACIN INJECTION            GENTAMYCIN 80MG                               50KU    Route of Admin            Irrigation                      Irrigation    Dose/Volume               50000 UNITS                     80 MG    (include amount and     unit of measure)     Site                      Knee                            Knee    Site Detail               Left                            Left    Administered By           MERRILL-MD,  FRANCIS PEPER,  KEITH    Outcome Met (O.130)       Yes                             Yes    Last Modified By:         MALVINA RN, RON JONETTA MALVINA, RN, RON D                               08/29/17 13:22:22               08/29/17 13:22:22    Post-Care Text:            E.20 Evaluates response to medications O.130 Patient receives appropriately administerd medication(s)      RH[facility] - Implants/Endoscopy Stents                                                                Pre-Care Text:            A.20 Verifies operative procedure, surgical site, and laterality A.20.1 Verifies consent for planned procedure           Im.350 Records implants inserted during the operative or invasive procedure                              Entry 1  Entry 2                         Entry 3                                          Implant/Explant           Implant                         Implant                         Implant    Catalog #                [id number]                      [id number]    Implant     Identification      Description              JOINT KNEE FEMORAL              JOINT KNEE TIBIAL               JOINT KNEE TIBIAL                              COMPONENT TRIATHLON CR          INSERT TRIATHLON CS             BASEPLATE/TRAY                              CEMENTLESS BEADED W/            2ND/3RD GEN #8            TRIATHLON TRITANIUM                              PERI-APETITE #8 LEFT                                           MOBILE BEARING SIZE 8     Expiration Date          02/20/21                        11/23/20                        10/04/21     Lot Number               [id number]                           [id number]                          [id number]     Unique ID Number  Surveyor, quantity             L  KNEE                         L  KNEE                         L  KNEE     Quantity                 1                               1                               1     Last Modified By:          MALVINA RN, RON JONETTA MALVINA, RN, RON JONETTA MALVINA, RN, RON D                              08/29/17 14:22:10               08/29/17 14:22:57               08/29/17 14:23:48                                Entry 4                                                                                                          Implant/Explant           Implant    Catalog #                [id number]    Implant     Identification      Description              JOINT KNEE PATELLA                              COMPONENT TRIATHLON                              [facility] ASYMMETRIC  METAL BACKED [id number] X                                  Expiration Date          01/26/22     Lot Number               [id number]     Unique ID Number      Manufacturer             [employer]     Serial Number     Usage Data      Implant Site             L  KNEE     Quantity                 1    Last Modified ByBETHA DIXONS, RN, RON D                              08/29/17 14:24:38    Post-Care Text:            E.30 Evaluates verification process for correct patient, site, side and level surgery O.30 Patient's procedure           is performed on the correct site, side, and level      RHOR - Implants/Endoscopy Stents Audit                                                           08/29/17 14:24:38         Owner: [employer]                               Modifier: WELLRO                                                        <+> 4         Description        <+> 4         Lot Number        <+> 4         Manufacturer        <+> 4         Expiration Date        <+> 4         Implant Site        <+> 4         Quantity        <+> 4         Catalog #        <+> 4         Implant/Explant     08/29/17 14:23:48         Owner: WELLRO  Modifier: WELLRO                                                        <+> 3         Description        <+> 3         Lot Number        <+> 3          Manufacturer        <+> 3         Expiration Date        <+> 3         Implant Site        <+> 3         Quantity        <+> 3         Catalog #        <+> 3         Implant/Explant     08/29/17 14:22:57         Owner: CLAYBORN                               Modifier: WELLRO                                                        <+> 2         Description        <+> 2         Lot Number        <+> 2         Manufacturer        <+> 2         Expiration Date        <+> 2         Implant Site        <+> 2         Quantity        <+> 2         Catalog #        <+> 2         Implant/Explant        RHOR - Tubes, Drains, Catheters                                                                 Pre-Care Text:            A.310 Identifies factors associated with an increased risk for hemorrhage or fluid and electrolyte imbalance           Im.250 Administers care to invasive device sites                              Entry 1  Device Description        DRAIN WOUND VAC SUCTION         Location                        Knee                              10FR MED    Location Detail           Left    Last Modified ByBETHA DIXONS, RN, RON D                              08/29/17 13:24:50    Post-Care Text:            E.340 Evaluates tubes and drains are intact and functioning as planned O.60 Patient is free from signs and           symptoms of injury caused by extraneous objects      RHOR - Communication                                                                            Pre-Care Text:            A.520 Identifies barriers to communication (Patient and Family Communications) A.20 Verifies operative           procedure, surgical site, and laterality (Hand[employer]) Im.500 Provides status reports to family           members Im.150 Develops individualized plan of care                              Entry 1                                                                                                           Communication             Phone Call                      Communication By                DIXONS RN, RON D    Date and Time             08/29/17 14:38:00               Communication To                WIFE    Last Modified By:  WELLS, RN, RON D                              08/29/17 14:38:41    Post-Care Text:            E.520 Evaluates psychosocial response to plan of care O.500 Patient or designated support person demonstrates           knowledge of the expected psychosocial responses to the procedure E.800 Ensures continuity of care O.50           Patient's current status is communicated throughout the continuum of care      RHOR - Dressing/Packing                                                                         Pre-Care Text:            A.350 Assesses susceptibility for infection Im.250 Administers care to invasive devices Im.290 Administer care           to wound sites  Im.300 Implements aseptic technique                              Entry 1                                                                                                          Site                      Knee                            Site Details                    Left    Dressing Item     Details      Dressing Item            Silver Impregnated              Miscellaneous                   Cold Therapy Pad     (Im.290)                 Dressing, Elastic wrap          (Im.290)     Last Modified ByBETHA DIXONS, RN, RON D                              08/29/17 13:19:40    Post-Care Text:  E.320 Evaluate factors associted with increased risk for postoperative infection at the completion of the           procedure O.200 Patient's wound perfusion is consistent with or improved from baseline levels  O.Patient is           free from signs and symptoms of infection      RHOR - Procedures                                                                                Pre-Care Text:            A.20 Verifies operative procedure, surgical site, and laterality Im.150 Develops individualized plan of care                              Entry 1                                                                                                          Procedure     Description      Procedure                Knee Arthroplasty Total         Modifiers                       Left                              SCIP     Surgical Procedure       LEFT TKA** STRYKER     Text     Primary Procedure         Yes                             Primary Surgeon                 ANNIE ECK    Start                     08/29/17 13:06:00               Stop                            08/29/17 14:48:00    Anesthesia Type           Spinal                          Surgical Service  Orthopedic (SN)    Wound Class               1-Clean    Last Modified By:         MALVINA, RN, RON D                              08/29/17 15:02:59    Post-Care Text:            O.730 The patinet's care is consistent with the individualized perioperative plan of care      RHOR - Transfer                                                                                                           Entry 1                                                                                                          Transferred By            BETTE ROUSE               Via                             Stretcher                              GWENN MALVINA, RN, RON                              D    Post-op Destination       PACU    Skin Assessment      Condition                Intact    Last Modified ByBETHA MALVINA, RN, RON D                              08/29/17 13:24:25      Case Comments                                                                                         <  None>              Finalized By: EVERARDO NEST      Document Signatures                                                                              Signed By:           MALVINA RN, RON D 08/29/17 15:03          Cilicia.Civatte,  JENNIFER 08/30/17 18:13      Unfinalized History                                                                                     Date/Time            Username    Reason for Unfinalizing         Freetext Reason for Unfinalizing                                          08/29/17 15:31       [facility]      Finish Documentation

## 2017-08-30 NOTE — Nursing Note (Signed)
Medication Administration Follow Up-Text       Medication Administration Follow Up Entered On:  08/30/2017 7:02 EDT    Performed On:  08/30/2017 5:55 EDT by Wilford SportsBOULWARE, RN, SHARNICE R      Intervention Information:     acetaminophen-hydrocodone  Performed by Wilford SportsBOULWARE, RN, SHARNICE R on 08/30/2017 04:55:00 EDT       HYDROcodone-acetaminophen,2tabs  Oral,moderate pain (4-7)       Med Response   ED Medication Response :   No adverse reaction, Symptoms improved   Numeric Rating Pain Scale :   4   [place] Opioid Induced Sedation Scale :   1 = Awake and alert   BOULWARE, RN, SHARNICE R - 08/30/2017 7:02 EDT

## 2017-08-30 NOTE — Case Communication (Signed)
CM Discharge Planning Assessment - Text       CM Discharge Planning Assessment Entered On:  08/30/2017 11:38 EDT    Performed On:  08/30/2017 11:35 EDT by Carles Collet R               Home Environment   Living Environment :   Living Situation: Home independently  Current Home Treatments:   Home Devices/Equipment   Professional Skilled Services:   Education administrator and Community Resources:   Sensory Deficits:   Performed by: Eulas Post, PT, [place] G-08/30/17 09:46:00       Affect/Behavior :   Appropriate   Lives With :   Significant other   Lives In :   Single level home   GRAF,  [place] R - 08/30/2017 11:35 EDT   [place]          Number of Stairs :    2               Rail :    Lonzo Cloud R - 08/30/2017 11:35 EDT         Living Situation :   Home independently   Albin :   Salome Holmes [phone]   Barriers at Home :   None   GRAF,  DEBRA R - 08/30/2017 11:35 EDT   Home Environment   Home Equipment Rehab :   [facility]   ADLs Prior to Admission :   Independent   Sensory Deficits :   None   GRAF,  DEBRA R - 08/30/2017 11:35 EDT   Discharge Needs I   Previously Documented Discharge Needs :   DISCHARGE PLAN/NEEDS:  EQUIPMENT/TREATMENT NEEDS:       Previously Documented Benefits Information :   No discharge data available.     Anticipated Discharge Date :   08/31/2017 EDT   Discharge To :   Home with family support   Home Caregiver Phone Number :   Birdena Jubilee [phone]   CM Progress Note :   08/30/17 met patient at bedside; lives with GF; plan home with her support; has home walker; chose [place] [facility]-choice paper signed; plan home tomorrow; [place] Breeding - 08/30/2017 11:35 EDT   Discharge Needs II   DischargDischarge Device/Equipment CMe Device/Equipment CM :   [facility] - Biomedical engineer Skilled Services :   Occupational Therapy, Physical Therapy   Needs Assistance with Transportation :   No   Discharge Transportation Arranged :   N/A   Needs  Assistance at Home Upon Discharge :   No   Requires Caregiver Involvement :   No   Discharge Planning Time Spent :   20 minutes   GRAF,  DEBRA R - 08/30/2017 11:35 EDT   Advance Directive   *Advance Directive :   No   Patient Wishes to Receive Further Information on Advance Directives :   No   Carles Collet R - 08/30/2017 11:35 EDT   Discharge Planning   Discharge Arrangements :       Patient Post-Acute Information    Patient Name: [name] Woods, [name] Woods  MRN: [id number]  FIN: [id number]  Gender: Male  DOB: Jan 25, 2066  Age:  52 Years        *** No Post-Acute Placement(s) Listed ***          ***  No Post-Acute Service(s) Listed [facility] Services :   [facility] [place],  [place] - 08/30/2017 11:35 EDT   Discharge Planning Assessment Status   Discharge Planning Assessment Complete :   Yes   GRAF,  DEBRA R - 08/30/2017 11:35 EDT

## 2017-08-30 NOTE — Progress Notes (Signed)
 Inpatient OT Evaluation - Text       Inpatient OT Evaluation Entered On:  08/30/2017 15:13 EDT    Performed On:  08/30/2017 13:53 EDT by STAR, OT, ERICA I               Reason for Treatment   Subjective Statement :   RN & pt agreeable to tx. Pt significant other present at bedside. Upon entering room, pt just worked with PT. Pt dressed upon arrival reporting he was able to don shorts threading L foot first. Pt left as found sitting in recliner with LEs elevated, heels floating, ice in place and all needs in reach, call bell in hand.     Pt indep at baseline with no use of AD. 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 Pt has tub shower access.      *Reason for Referral :   Pt s/p L TKA, WBAT.     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     Pain Present :   Yes actual or suspected pain   Orientation Assessment :   Oriented x 4   Affect/Behavior :   Cooperative   RENGERING, OT, ERICA I - 08/30/2017 15:03 EDT   Pain Assessment   Pain Location :   Leg   Laterality :   Left   Duration :   reporting [place] in hamstrings as main pain source   Self Report Pain :   Numeric rating scale   Numeric Pain Scale :   6   Numeric Pain Score :   6    RENGERING, OT, ERICA I - 08/30/2017 15:03 EDT   Pain Assessment Detail   Interventions Implemented :   Cold   OT Pain Notification :   Notified nursing   [place], OT, ERICA I - 08/30/2017 15:03 EDT   Home Environment   Living Environment :   Home Environment  Devices/Equipment at Home:  Walker - Rolling  Performed By:  CHERRIE LEW R 08/30/2017  Lives In:  Single  level home  Performed By:  CHERRIE LEW SAUNDERS 08/30/2017  Lives With:  Significant other  Performed By:  CHERRIE LEW SAUNDERS 08/30/2017  Living Situation:  Home independently  Performed By:  CHERRIE LEW SAUNDERS 08/30/2017  Number of Stairs Outside:  2  Performed By:  CHERRIE LEW SAUNDERS 08/30/2017  Outside Stairs Rail:  Bilateral  Performed By:  CHERRIE LEW SAUNDERS 08/30/2017  Sensory Deficits:  None  Performed By:  CHERRIE LEW SAUNDERS 08/30/2017  *ADL:  Independent  Performed By:  FRANCHOT, PT, [place]  G 08/30/2017  *Cognitive-Communication Skills:  Independent  Performed By:  FRANCHOT, PT, [place]  G 08/30/2017  *Instrumental ADL:  Independent  Performed By:  FRANCHOT, PT, [place]  G 08/30/2017  *Mobility:  Independent  Performed By:  FRANCHOT, PT,   G 08/30/2017     Living Situation :   Home independently   Lives With :   Significant other   Lives In :  Single level home   RENGERING, OT, ERICA I - 08/30/2017 15:03 EDT   Rehabilitation Stairs Grid     Outside Stairs          Number of Stairs :    2               Rail :    Bilateral                [place], OT, ERICA I - 08/30/2017 15:03 EDT         Home Setup Grid   Primary Bedroom :   1st floor   Primary Bathroom :   1st floor   Kitchen :   1st floor   Laundry :   1st floor   [place], OT, ERICA I - 08/30/2017 15:03 EDT   Patient's Responsibilities Rehab :   Community mobility, Employed, Personal ADL   [place], OT, ERICA I - 08/30/2017 15:03 EDT   Home Environment II   Living Environment :   Living Situation: Home independently  Current Home Treatments:   Home Devices/Equipment   Professional Skilled Services:   Therapist, sports and Community Resources:   Sensory Deficits:   Performed by: FRANCHOT, PT, [place]  G-08/30/17 09:46:00       Devices/Equipment at Home :   [place] - [address]   [place], OT, ERICA I - 08/30/2017 15:03 EDT   Prior Functional Status Grid   ADL :   Independent   Mobility :   Independent   Instrumental ADL :   Independent   Cognitive-Communication Skills :   Independent    RENGERING, OT, ERICA I - 08/30/2017 15:03 EDT   OT Basic ADL   Basic ADL Grid   Eating :   Complete independence   Grooming :   Supervision or setup   Bathing :   Supervision or setup   UE Dressing :   Supervision or setup   LE Dressing :   Minimal contact assistance   RENGERING, OT, ERICA I - 08/30/2017 15:03 EDT   ADL Comments :   Based on functional observation and pt report. Pt reports dressing himself earlier this date (donning shirt with setup, donning shorts with setup threading LLE first). Pt able to doff socks, pt requiring min A to don L sock, able to don R sock. Pt educated on compensatory strategies, home safety with bathroom t/fs.      RENGERING, OT, ERICA I - 08/30/2017 15:03 EDT   Cognition   Cognition :   Within functional limits   RENGERING, OT, ERICA I - 08/30/2017 15:03 EDT   UE Strength/ROM   Upper Extremity Overall ROM Grid   Left Upper Extremity Active Range :   Impaired   (Comment: WFL - imparied due to h/o brachial plexus injury [place], OT, ERICA I - 08/30/2017 15:03 EDT] )   Right Upper Extremity Active Range :   Within functional limits   RENGERING, OT, ERICA I - 08/30/2017 15:03 EDT   Lt Upper Extremity Strength :   Within functional limits   Rt Upper Extremity Strength :   Within functional limits   RENGERING, OT, ERICA I - 08/30/2017 15:03 EDT   UE Function   Previous Hand Dominance :   Right   Current Hand Dominance :   Right   RENGERING, OT, ERICA I - 08/30/2017 15:03 EDT   UE Coordination   Left :   19.84   Right :   18.93   RENGERING, OT, ERICA I -  08/30/2017 15:03 EDT   DME OT   Equipment Anticipated or Recommended :   Commode   RENGERING, OT, ERICA I - 08/30/2017 15:03 EDT   Education   Responsible Learner Present for Session :   Yes   Teaching Method :   Demonstration, Explanation   RENGERING, OT, ERICA I - 08/30/2017 15:03 EDT   Occupational Therapy Education Grid   Activity of Daily Living Training :   [employer] understanding, Demonstrates   Home Safety :   Verbalizes understanding,  Demonstrates   RENGERING, OT, ERICA I - 08/30/2017 15:03 EDT   Additional Learner(s) Present :   Significant other   OT Additional Education :   Pt educated on On Q pump safety.      [place], OT, ERICA I - 08/30/2017 15:03 EDT   Long Term Goals   OT LT Goals Reviewed :   Yes   RENGERING, OT, ERICA I - 08/30/2017 15:03 EDT   Short Term Goals   OT ST Goals Reviewed :   Yes   RENGERING, OT, ERICA I - 08/30/2017 15:03 EDT   Plan   OT Evaluation Date :   08/30/2017 EDT   OT Frequency Acute :   Once   Planned Treatments :   Discharge/Discontinue OT treatment   Treatment Plan/Goals Established With Patient/Caregiver :   Yes   OT Evaluation Complete :   Yes   RENGERING, OT, ERICA I - 08/30/2017 15:03 EDT   Time Spent With Patient   OT Evaluation Units, Low Complexity :   1 Unit   OT Time In :   13:35 EST   OT Time Out :   13:53 EST   OT Individual Eval Time, Low Complexity :   18 minutes   OT Total Individual Therapy Time :   18 minutes   OT Total Untimed Code Treatment Minutes :   18 minutes   OT Total Treatment Time :   18 minutes   RENGERING, OT, ERICA I - 08/30/2017 15:03 EDT   Assessment   OT Impairments or Limitations :   Basic activity of daily living deficits   OT Treatment Recommendations :   Pt s/p L TKA, presents with requiring min A to don L sock. Pt and pt significant other educated on compensatory strategies, home safety with bathroom t/fs, and all DME needs. Did not attempt standing tolerance activities at this time as pt had just worked with PT and was c/o muscle spasms/pain - please refer to PT Eval for functional mobility. Pt verbalizing understanding of ADL adaptations and all questions answered. Do not anticipate further OT needs at this time. Recommend HH PT. anticipate d/c home with significant other support. recommended elevated commode seat and shower chair/tub t/f bench as needed.      RENGERING, OT, ERICA I - 08/30/2017 15:03 EDT

## 2017-08-30 NOTE — Progress Notes (Signed)
Inpatient PT Examination - Text       Inpatient PT Examination Entered On:  08/30/2017 9:57 EDT    Performed On:  08/30/2017 9:46 EDT by Montez Morita, PT, [place] G               Reason for Treatment   Subjective Statement :   pt/RN agreed to PT evaluation.  Pt resting in bed, eager to get up to walk and use the bathroom.  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; L hand and UE weakness     CARTER, PT, [place] G - 08/30/2017 9:46 EDT   General Info   Physical Therapy Orders :   PT Evaluation and Treatment Acute - 08/29/17 14:56:00 EDT, Stop date 08/29/17 14:56:00 EDT, on day of surgery if patient arrives by 3PM     Precautions RTF :    Notify Provider Vital Signs, 08/29/17 14:56:00 EDT, HR greater than 120, HR less than 50, 08/29/17 14:56:00 EDT, Ordered   Notify Provider Vital Signs, 08/29/17 14:56:00 EDT, Temp greater than 38.3, 08/29/17 14:56:00 EDT, Ordered   Notify Provider Vital Signs, 08/29/17 14:56:00 EDT, SBP greater than 180, SBP less than 90, 08/29/17 14:56:00 EDT, Ordered   Notify Rapid Response Team, 08/29/17 14:56:00 EDT, For concerns regarding patient condition & notify MD, 08/29/17 14:56:00 EDT, 08/29/17 14:56:00 EDT, Ordered   Turn Patient, 08/29/17 14:56:00 EDT, Constant Order, PRN, q2hr and monitor heels for pressure, Ordered   Weight Bearing, 08/29/17 14:56:00 EDT, as tolerated, Ordered   Notify Rapid Response Team, 08/29/17 10:50:00 EDT, For concerns regarding patient condition & notify MD, 08/29/17 10:50:00 EDT, 08/29/17 10:50:00 EDT, Ordered   Communication to Nursing, 08/29/17 7:38:00 EDT, Administer oral/IV analgesics as ordered by surgeon., 08/29/17 7:38:00 EDT, 08/29/17 7:38:00 EDT, Ordered   Notify Anesthesia, 08/29/17 7:38:00 EDT, problems with catheter site., 08/29/17 7:38:00 EDT, 08/29/17 7:38:00 EDT, Ordered     Orientation Assessment :   Oriented x 4   Affect/Behavior :   Appropriate    Basic Command Following :   Intact   Safety/Judgment :   Intact   Pain Present :   Yes actual or suspected pain   CARTER, PT,  G - 08/30/2017 9:46 EDT   Problem List   (As Of: 08/30/2017 09:57:11 EDT)   Problems(Active)    ADD (attention deficit disorder) (SNOMED CT  :1610960454 )  Name of Problem:   ADD (attention deficit disorder) ; Recorder:   Maudie Flakes, RN, [place]el[place]; Confirmation:   Confirmed ; Classification:   Patient Stated ; Code:   [id number] ; Contributor System:   Dietitian ; Last Updated:   08/17/2017 14:46 EDT ; Life Cycle Date:   08/17/2017 ; Life Cycle Status:   Active ; Vocabulary:   SNOMED CT        Alteration in comfort: pain (SNOMED CT  :29562130 )  Name of Problem:   Alteration in comfort: pain ; Recorder:   SYSTEM,  SYSTEM; Confirmation:   Confirmed ; Classification:   Interdisciplinary ; Code:   [id number] ; Last Updated:   08/29/2017 16:56 EDT ; Life Cycle Date:   08/29/2017 ; Life Cycle Status:   Active ; Vocabulary:   SNOMED CT   ; Comments:        08/29/2017 16:56 - SYSTEM,  SYSTEM  Problem added automatically by system  based on initiation of Alteration in [place] Plan of Care      Anesthesia complication (SNOMED CT  :40981191 )  Name of Problem:   Anesthesia complication ; Recorder:   Maudie Flakes, RN, Neldon [place]; Confirmation:   Confirmed ; Classification:   Patient Stated ; Code:   [id number] ; Contributor System:   Dietitian ; Last Updated:   08/17/2017 15:28 EDT ; Life Cycle Status:   Active ; Vocabulary:   SNOMED CT   ; Comments:        08/17/2017 15:28 - Maudie Flakes, RN, Neldon [place]  HARD TO WAKE AFTER RIGHT SHOULDER SURGERY 09/2009      Anxiety (SNOMED CT  :13086578 )  Name of Problem:   Anxiety ; Recorder:   Maudie Flakes, RN, [place]; Confirmation:   Confirmed ; Classification:   Patient Stated ; Code:   46962952 ; Contributor System:   PowerChart ; Last Updated:   08/17/2017 14:46 EDT ; Life Cycle Date:   08/17/2017 ; Life Cycle Status:   Active ; Vocabulary:   SNOMED CT        Bulging lumbar disc (SNOMED  CT  :8413244010 )  Name of Problem:   Bulging lumbar disc ; Recorder:   Maudie Flakes, RN, Neldon [place]; Confirmation:   Confirmed ; Classification:   Patient Stated ; Code:   [id number] ; Contributor System:   Dietitian ; Last Updated:   08/17/2017 14:48 EDT ; Life Cycle Date:   08/17/2017 ; Life Cycle Status:   Active ; Vocabulary:   SNOMED CT        Gastric ulcer (SNOMED CT  :3474259563 )  Name of Problem:   Gastric ulcer ; Recorder:   Maudie Flakes, RN, N[place]; Confirmation:   Confirmed ; Classification:   Patient Stated ; Code:   [id number] ; Contributor System:   PowerChart ; Last Updated:   08/17/2017 15:28 EDT ; Life Cycle Date:   08/17/2017 ; Life Cycle Status:   Active ; Vocabulary:   SNOMED CT        History of IBS (SNOMED CT  :1884166063 )  Name of Problem:   History of IBS ; Recorder:   Maudie Flakes, RN, Neldon [place]; Confirmation:   Confirmed ; Classification:   Patient Stated ; Code:   [id number] ; Contributor System:   PowerChart ; Last Updated:   08/17/2017 14:48 EDT ; Life Cycle Date:   08/17/2017 ; Life Cycle Status:   Active ; Vocabulary:   SNOMED CT        Osteoarthritis (SNOMED CT  :5573220254 )  Name of Problem:   Osteoarthritis ; Recorder:   Maudie Flakes, RN, [place]; Confirmation:   Confirmed ; Classification:   Patient Stated ; Code:   2706237628 ; Contributor System:   Dietitian ; Last Updated:   08/17/2017 14:46 EDT ; Life Cycle Date:   08/17/2017 ; Life Cycle Status:   Active ; Vocabulary:   SNOMED CT        Seasonal allergies (SNOMED CT  :3151761607 )  Name of Problem:   Seasonal allergies ; Recorder:   Maudie Flakes, RN, N[place]; Confirmation:   Confirmed ; Classification:   Patient Stated ; Code:   [id number] ; Contributor System:   Dietitian ; Last Updated:   08/17/2017 14:46 EDT ; Life Cycle Date:   08/17/2017 ; Life Cycle Status:   Active ; Vocabulary:   SNOMED CT          Diagnoses(Active)    Knee osteoarthritis  Date:   08/29/2017 ;  Diagnosis Type:   Discharge ; Confirmation:   Confirmed ; Clinical Dx:   Knee osteoarthritis ;  Classification:   Medical ; Clinical Service:   Non-Specified ; Code:   ICD-10-CM ; Probability:   0 ; Diagnosis Code:   M17.10        Pain Assessment   Pain Location :   Other: L knee 5/10   CARTER, PT, [place] G - 08/30/2017 9:46 EDT   Home Environment   Living Environment :   Home Environment  Living Situation:  Home independently  Performed By:  Senaida Ores RN, Conni Elliot M 08/29/2017  Sensory Deficits:  None  Performed By:  Senaida Ores RN, CHRISTAN M 08/29/2017     Living Situation :   Home independently   Lives With :   Spouse   Lives In :   Single level home   [place], [place], [place] G - 08/30/2017 9:46 EDT   Stairs     Outside Stairs          Number of Stairs :    2               Rail :    Bilateral              Comment  (Comment: rails are set wide apart [place], PT, [place] G - 08/30/2017 9:46 EDT] )         [place]ae , [place] G - 08/30/2017 9:46 EDT         Home Environment II   Living Environment :   Home Environment  Living Situation:  Home independently  Performed By:  Lillia [place] M 08/29/2017  Sensory Deficits:  None  Performed By:  Senaida Ores RN, Conni Elliot M 08/29/2017     Devices/Equipment :   [name], PT, [place] G - 08/30/2017 9:46 EDT   Prior Functional Status   ADL :   Independent   Mobility :   Independent   Instrumental ADL :   Independent   Cognitive-Communication Skills :   Independent   CARTER, PT, [place] G - 08/30/2017 9:46 EDT   LE Range/Strength   LE Overall Range of Motion Grid   Left Lower Extremity Active Range :   Impaired, 20*-70*   CARTER, PT, [place] G - 08/30/2017 9:46 EDT   Left Lower Extremity Strength Grid   Knee Extension :   4   CARTER, PT, [place] G - 08/30/2017 9:46 EDT   Rt Lower Extremity Strength :   Within functional limits   [place], PT, [place] G - 08/30/2017 9:46 EDT   UE Strength/ROM   Lt Upper Extremity Strength :   Within functional limits   Rt Upper Extremity Strength :   Within functional limits   [place], PT, [place] G - 08/30/2017  9:46 EDT   Sensation   Left Lower Extremity Sensation   Light Touch :   Intact   CARTER, PT, [place] G - 08/30/2017 9:46 EDT   Right Lower Extremity Sensation   Light Touch :   Intact   CARTER, PT, [place] G - 08/30/2017 9:46 EDT   Mobility   Mobility Grid   Supine to Sit :   Close supervision   Transfer Sit to Stand :   Rehab Minimal assistance   Transfer Stand to Sit :   Rehab Minimal assistance   Transfer Bed to and From Chair :   Rehab Minimal assistance   Transfer Toilet :   Rehab Minimal assistance  CARTER, PT, [place] G - 08/30/2017 9:46 EDT   Ambulation Level :   Minimal contact assistance   Ambulation Quality :   pt initially keeping NWB, req VCs and encouragement to incr to WBAT through L LE.  Knee flexed throughout gait but no buckling.  5' from bed > bathroom, bathroom > hallway.  Limited most by pain   Ambulation Distance :   10 ft   [place], [place], [place] G - 08/30/2017 9:46 EDT   Balance   Balance Tests Performed :   [facility] balance scale   Sitting Balance Score :   5   Standing Balance Score :   1+   CARTER, PT, [place] G - 08/30/2017 9:46 EDT   Assessment   PT Impairments or Limitations :   Ambulation deficits, Balance deficits, Endurance deficits, Pain limiting function, Range of motion deficits, Strength deficits, Transfer deficits   Barriers to Safe Discharge PT :   Medical diagnosis   Discharge Recommendations :   anticipate home with family support, HHPT; will likely need to stay 2 nights in hospital     PT Treatment Recommendations :   Pt is a 52 y.o. M s/p L TKA presenting with moderate pain, weakness, and decr endurance for functional activity.  Pt also with hx of L brachial plexus injury as a child, thus with residual L UE and hand weakness affecting his ability to safely use RW for support.  Pt was able to mobilize OOB and to the bathroom safely with min A and RW.  He needed VCs for sequencing and safe techniques and was able to progress to WBAT with step-to gait pattern by end of  session.  Further IP PT is needed for further gait training, strengthening, and endurance training prior to dc.  Anticipate pt needing a 2nd day of PT before he can safely return home with family support and HHPT.      KamailiARTER, PT, [place] G - 08/30/2017 9:46 EDT   Long Term Goals   PT Mobility Independence Measure LTG   Bed, Chair, Wheelchair Goal :   Complete independence - 7   Ambulation Level Surfaces Goal :   Modified independence - 6   CARTER, PT, [place] G - 08/30/2017 9:46 EDT   Mode of Locomotion Goal :   Walk   PT LT Goals Reviewed :   Yes   CARTER, PT, [place] G - 08/30/2017 9:46 EDT   Short Term Goals   Other PT Goals Grid     Goal #1  Goal #2  Goal #3  Goal #4    Goal :    supine <> sit x SBA   sit <> stand x CGA   amb x RW x CGA x 500'x reciprocal step pattern   amb up/down 5 steps with single rail and cane     Status :    Initial   Initial   Initial   Initial       CARTER, PT, [place] G - 08/30/2017 9:46 EDT  CARTER, PT, [place] G - 08/30/2017 9:46 EDT  CARTER, PT, [place] G - 08/30/2017 9:46 EDT  CARTER, PT,  G - 08/30/2017 9:46 EDT        Goal #5          Goal :    I and compliant with TKA HEP              Status :    Initial  [place], PT, [place] G - 08/30/2017 9:46 EDT         PT ST Goals Reviewed :   Loma Sender, PT, [place] G - 08/30/2017 9:46 EDT   Plan   PT Frequency Acute :   BID   Duration :   2    PT Duration Unit Rehab :   Days   Treatments Planned :   Other: bed mobility, transfer/gait/stair training, review of HEP   Treatment Plan/Goals Established With Patient/Caregiver :   Yes   Evaluation Complete :   Yes   CARTER, PT, [place] G - 08/30/2017 9:46 EDT   Time Spent With Patient   PT Evaluation Units, Low Complexity :   1 Unit   PT Time In :   7:59 EST   PT Time Out :   8:29 EST   PT Individual Eval Time, Low Complexity :   15 minutes   PT Therapeutic Activity Units :   1 units   PT Therapeutic Activity Time :   15 minutes   PT Total Individual Min :   15    PT Total  Timed Code Treatment Units :   1 units   PT Total Timed Code Min :   15    PT Total Untimed Min Ac/Outp :   15    PT Total Treatment Time Ac/Outp :   30    CARTER, PT, [place] G - 08/30/2017 9:46 EDT   Additional Information   Additional Information PT :   pt left seated in chair with LEs elevated, all needs in reach, instructed on TKA at end of session.  Wife with pt upon exit of room.      [place], PT,  G - 08/30/2017 9:46 EDT

## 2017-08-30 NOTE — Nursing Note (Signed)
Medication Administration Follow Up-Text       Medication Administration Follow Up Entered On:  08/30/2017 9:21 EDT    Performed On:  08/30/2017 9:21 EDT by Senaida OresICHARDSON, RN, CHRISTAN M      Intervention Information:     acetaminophen-hydrocodone  Performed by Senaida OresICHARDSON, RN, CHRISTAN M on 08/30/2017 09:03:00 EDT       HYDROcodone-acetaminophen,2tabs  Oral,moderate pain (4-7)       Med Response   ED Medication Response :   No adverse reaction, Symptoms improved   Numeric Rating Pain Scale :   4   [place] Opioid Induced Sedation Scale :   1 = Awake and alert   Respiratory Rate :   16 br/min   RICHARDSON, RN, CHRISTAN M - 08/30/2017 9:21 EDT

## 2017-08-30 NOTE — Progress Notes (Signed)
OT Time Spent with Patient Acute/OP-Text       OT Time Spent With Patient Outpatient/Acute Entered On:  08/30/2017 11:09 EDT    Performed On:  08/30/2017 9:10 EDT by Gerlean Ren, OT, ERICA I               Time Spent With Patient   OT Individual Eval Time, Low Complexity :   0 minutes   OT Total Individual Therapy Time :   0 minutes   OT Total Untimed Code Treatment Minutes :   0 minutes   OT Total Treatment Time :   0 minutes   RENGERING, OT, ERICA I - 08/30/2017 11:08 EDT   OT Units Cancelled Missed     OT Units Lost #1          Reason :    Other: RN reporting pt painful and having muscle spasms at this time, requesting OT to return later as able.                 RENGERING, OT, ERICA I - 08/30/2017 11:08 EDT

## 2017-08-30 NOTE — Progress Notes (Signed)
Inpatient PT Daily Documentation - Text       Inpatient PT Daily Documentation Entered On:  08/30/2017 14:27 EDT    Performed On:  08/30/2017 14:19 EDT by Montez Morita, PT, [place] G               Reason for Treatment   Subjective Statement :   1.  pt/RN agreed to PT session.  reports con't feeling "tight" and has been doing the HEP since this morning.  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; L hand and UE weakness     CARTER, PT, [place] G - 08/30/2017 14:19 EDT   Review/Treatments Provided   PT Goals :   PT Short Term Goals    08/30/2017  Other PT Goal #1: supine <> sit x SBA; Initial  Other PT Goal #2: sit <> stand x CGA; Initial  Other PT Goal #3: amb x RW x CGA x 500'x reciprocal step pattern; Initial  Other PT Goal #4: amb up/down 5 steps with single rail and cane; Initial  Other PT Goal #5: I and compliant with TKA HEP; Initial     PT Plan :   Treatment Duration: 2 Performed By: Tilden Dome  08/30/2017 09:46  Planned Treatments: Other: bed mobility, transfer/gait/stair training, review of HEP Performed By: Tilden Dome  08/30/2017 09:46     Physical Therapy Orders :   Physical Therapy Additional Treatment Acute - 08/30/17 9:57:12 EDT, Other: bed mobility, transfer/gait/stair training, review of HEP, for 2 Days, Stop date 09/01/17 9:56:00 EDT, BID     Pain Present :   Yes actual or suspected pain   PT Therapeutic Activity,Mobility,Balance :   Yes   CARTER, PT, [place] G - 08/30/2017 14:19 EDT   Pain Assessment   Pain Location :   Other: L knee 5/10   CARTER, PT, [place] G - 08/30/2017 14:19 EDT   Therapeutic Activities/Mobility/Balance   PT Therapeutic Activities Grid     Activity 1          Activity :    Functional mobility, Gait, Posture, Tolerance to upright position, Transfer training              Assist :    Contact guard assistance, Minimal assistance              Position :    Standing              Equipment :     Rolling walker                [place], PT, [place] G - 08/30/2017 14:19 EDT         Assessment   PT Impairments or Limitations :   Ambulation deficits, Balance deficits, Endurance deficits, Pain limiting function, Range of motion deficits, Strength deficits, Transfer deficits   Barriers to Safe Discharge PT :   Medical diagnosis   Discharge Recommendations :   home vs. brief skilled rehab     [place], PT, [place] G - 08/30/2017 14:19 EDT   PT Treatment Recommendations :   1.  Pt standing in room after using urinal upon arrival, no AD near.  Sat back to chair with SBA.  Pt taken in chair out to hallway for gait training.  Sit> stand x CGA to RW.  Amb x  RW x 150' total with step-to gait pattern, VCs for posture.  Brief standing rest breaks approx every 50'. Increased time needed to complete distance. 2 instances of slight LOB from which pt was able to correct with min A from PT.  After completion of ambulation, pt returned to sit x min A and was taken back to room.  Reviewed TKA HEp verbally as OT was arriving to conduct OT evaluation.  Pt will all needs in reach upon exit of room.    P: pt progressed well this afternoon and was able to ambulate 150' with min/CGA and RW.  He demonstrates con't pain and swelling in his operative knee as to be expected.  His endurance is limited and he needs increased time to ambulate short distances. Further IP PT is needed for further gait and stair training next session.  Pt could benefit from further skilled rehab at dc vs. return home with HHPT services and wife's support.        [place], PT, [place] G - 08/30/2017 14:27 EDT   Time Spent With Patient   PT Time In :   13:10 EST   PT Time Out :   13:37 EST   PT Therapeutic Activity Units :   2 units   PT Therapeutic Activity Time :   27 minutes   PT Total Timed Code Treatment Units :   2 units   PT Total Timed Code Min :   27    PT Total Treatment Time Ac/Outp :   74    CARTER, PT, [place] G - 08/30/2017 14:27 EDT

## 2017-08-30 NOTE — Nursing Note (Signed)
Medication Administration Follow Up-Text       Medication Administration Follow Up Entered On:  08/30/2017 18:16 EDT    Performed On:  08/30/2017 18:16 EDT by Senaida OresICHARDSON, RN, CHRISTAN M      Intervention Information:     acetaminophen-hydrocodone  Performed by Senaida OresICHARDSON, RN, CHRISTAN M on 08/30/2017 18:09:00 EDT       HYDROcodone-acetaminophen,2tabs  Oral,moderate pain (4-7)       Med Response   ED Medication Response :   No adverse reaction, Symptoms improved   Numeric Rating Pain Scale :   3   Pasero Opioid Induced Sedation Scale :   1 = Awake and alert   Respiratory Rate :   16 br/min   RICHARDSON, RN, CHRISTAN M - 08/30/2017 18:16 EDT

## 2017-08-30 NOTE — Nursing Note (Signed)
Medication Administration Follow Up-Text       Medication Administration Follow Up Entered On:  08/30/2017 13:46 EDT    Performed On:  08/30/2017 13:46 EDT by Senaida OresICHARDSON, RN, CHRISTAN M      Intervention Information:     acetaminophen-hydrocodone  Performed by Ova FreshwaterVACCARO, RN, Lannie FieldsORINNE G on 08/30/2017 13:27:00 EDT       HYDROcodone-acetaminophen,2tabs  Oral,moderate pain (4-7)       Med Response   ED Medication Response :   No adverse reaction, Symptoms improved   Numeric Rating Pain Scale :   3   Pasero Opioid Induced Sedation Scale :   1 = Awake and alert   Respiratory Rate :   16 br/min   RICHARDSON, RN, CHRISTAN M - 08/30/2017 13:46 EDT

## 2017-08-30 NOTE — Nursing Note (Signed)
Medication Administration Follow Up-Text       Medication Administration Follow Up Entered On:  08/30/2017 2:43 EDT    Performed On:  08/30/2017 1:45 EDT by Wilford SportsBOULWARE, RN, SHARNICE R      Intervention Information:     acetaminophen-hydrocodone  Performed by Wilford SportsBOULWARE, RN, SHARNICE R on 08/30/2017 00:43:00 EDT       HYDROcodone-acetaminophen,2tabs  Oral,moderate pain (4-7)       Med Response   ED Medication Response :   No adverse reaction, Symptoms improved   Numeric Rating Pain Scale :   7   Pasero Opioid Induced Sedation Scale :   S = Sleep, easy to arouse   Wilford SportsBOULWARE, RN, SHARNICE R - 08/30/2017 2:43 EDT

## 2017-08-31 NOTE — Case Communication (Signed)
CM Discharge Planning Assessment - Text       CM Discharge Planning Ongoing Assessment Entered On:  08/31/2017 12:58 EDT    Performed On:  08/31/2017 12:57 EDT by Eliezer Mccoy               Discharge Needs I   Previously Documented Discharge Needs :   DISCHARGE PLAN/NEEDS:  Anticipated Discharge Date: 08/31/2017 - Heywood Iles - 08/30/17 11:35:00  Discharge To, Anticipated: Home with family support - GRAF,  DEBRA R - 08/30/17 11:35:00  Needs Assistance with Transportation: No - GRAF,  DEBRA R - 08/30/17 11:35:00  EQUIPMENT/TREATMENT NEEDS:  Needs Assistance at Home Upon Discharge: No Carles Collet R - 08/30/17 11:35:00  Professional Skilled Services: Occupational Therapy, Physical Therapy - GRAF,  [place] - 08/30/17 11:35:00       Previously Documented Benefits Information :   No discharge data available.     Anticipated Discharge Date :   08/31/2017 EDT   Discharge To :   Home with family support   Home Caregiver Phone Number :   Birdena Jubilee [phone]   CM Progress Note :   08/30/17 met patient at bedside; lives with GF; plan home with her support; has home walker; chose [facility] HH-choice paper signed; plan home tomorrow; DG  08/31/17 MS: Pt medically cleared for dc home today. [facility] updated accordingly. No other CM needs noted.      Eliezer Mccoy - 08/31/2017 12:57 EDT   Discharge Needs II   DischargDischarge Device/Equipment CMe Device/Equipment CM :   Walker - Biomedical engineer Skilled Services :   Occupational Therapy, Physical Therapy   Needs Assistance with Transportation :   No   Discharge Transportation Arranged :   N/A   Needs Assistance at Home Upon Discharge :   No   Requires Caregiver Involvement :   No   Discharge Planning Time Spent :   10 minutes   Eliezer Mccoy - 08/31/2017 12:57 EDT   Discharge Planning   Discharge Arrangements :       Patient Post-Acute Information    Patient Name: [name] [name], [name] [name]  MRN: [id number]  FIN: [id number]  Gender: Male  DOB: [birth year]  Age:  52 Years        ***  No Post-Acute Placement(s) Listed ***          *** No Post-Acute Service(s) Listed [facility] Services :   [facility] [phone]     Barriers to Discharge Identified :   None identified   Eliezer Mccoy - 08/31/2017 12:57 EDT   Discharge Planning Assessment Status   Discharge Planning Assessment Complete :   Yes   Eliezer Mccoy - 08/31/2017 12:57 EDT

## 2017-08-31 NOTE — Nursing Note (Signed)
Nursing Discharge Summary - Text       Nursing Discharge Summary Entered On:  08/31/2017 16:20 EDT    Performed On:  08/31/2017 16:20 EDT by Ruel FavorsGIPE, RN, Rayvon CharLINDSAY S               DC Information   Discharge To, Anticipated :   Home with home health   Devices/Equipment :   Data processing managerWalker - Rolling   Professional Skilled Services, Anticipated :   Physical Therapy   Transportation :   Private vehicle   Accompanied By :   Conception ChancySpouse   Mode of Discharge :   Wheelchair   GIPE, RN, Lillia AbedLINDSAY S - 08/31/2017 16:20 EDT   Education   Responsible Learner(s) :   Living Situation: Home independently        Performed by: Rhys MartiniENGERING, OT, ERICA I - 08/30/2017 13:53  Discharge To: Home with family support        Performed by: Tamala SerSingleton,  Monique - 08/31/2017 12:57  Home Caregiver Phone Number: Kandis CockingMarjori [phone]        Performed by: Tamala SerSingleton,  Monique - 08/31/2017 12:57     Home Caregiver Present for Session :   Yes   Barriers To Learning :   None evident   Teaching Method :   Demonstration, Explanation, Printed materials, Teach-back   [place], RRayvon Char, LINDSAY S - 08/31/2017 16:20 EDT   Post-Hospital Education Adult Grid   Activity Expectations :   Verbalizes understanding   Bowel Management :   Verbalizes understanding   Equipment/Devices :   Verbalizes understanding   Importance of Follow-Up Visits :   Verbalizes understanding   Pain Management :   Verbalizes understanding   Physical Limitations :   Verbalizes understanding   Postoperative Instructions :   Verbalizes understanding   When to Call Health Care Provider :   Bristol-Myers SquibbVerbalizes understanding   Ruel FavorsGIPE, RRayvon Char, LINDSAY S - 08/31/2017 16:20 EDT   Health Maintenance Education Adult Grid   Bathing/Hygiene :   TEFL teacherVerbalizes understanding   Diet/Nutrition :   TEFL teacherVerbalizes understanding   GIPE, RN, Rayvon CharLINDSAY S - 08/31/2017 16:20 EDT   Medication Education Adult Grid   Med Dosage, Route, Scheduling :   TEFL teacherVerbalizes understanding   Med Generic/Brand Name, Purpose, Action :   IT sales professionalVerbalizes understanding   Safety, Medication :    Verbalizes understanding   Ruel FavorsGIPE, RN, Rayvon CharLINDSAY S - 08/31/2017 16:20 EDT   Safety Education Adult Grid   Safety, Fall :   Verbalizes understanding   [place] Springs[place], RN, Rayvon CharLINDSAY S - 08/31/2017 16:20 EDT   Education Referral Made To :   Physical Therapy   Additional Learner(s) Present :   Spouse   GIPE, RN, LINDSAY S - 08/31/2017 16:20 EDT

## 2017-08-31 NOTE — Discharge Summary (Signed)
 Inpatient Clinical Summary             [facility]  Post-Acute Care Transfer Instructions  PERSON INFORMATION   Name: [name] [name], [name] [name]   MRN: [id number]    FIN#: [id number]   PHYSICIANS  Admitting Physician: ANNIE ECK  Attending Physician: ANNIE ECK   PCP: AZUCENA ELSPETH GREET  Discharge Diagnosis: Knee osteoarthritis  Comment:       PATIENT EDUCATION INFORMATION  Instructions:               Medication Leaflets:               Follow-up:                                   MEDICATION LIST  Medication Reconciliation at Discharge:          [facility] Medications  [facility] [address] [place], [place] [id number], [phone]  aspirin (aspirin 81 mg oral delayed release tablet) 1 Tabs Oral (given by mouth) every 12 hours. Refills: 0.  Last Dose:____________________  Printed Prescriptions  acetaminophen-oxyCODONE (Percocet 7.5/325 oral tablet) 2 Tabs Oral (given by mouth) every 4 hours as needed as needed for pain. Refills: 0., MAX DAILY DOSE OF ACETAMINOPHEN = 4000 MG  Last Dose:____________________  Medications That Have Not Changed  Other Medications  acetaminophen (Tylenol Extra Strength 500 mg oral tablet) 2 Tabs Oral (given by mouth) every 4 hours as needed for pain.  Last Dose:____________________  dextroamphetamine-amphetamine (Adderall 20 mg oral tablet) 1 Tabs Oral (given by mouth) 4 times a day.  Last Dose:____________________  venlafaxine (venlafaxine 150 mg oral capsule, extended release) 1 Capsules Oral (given by mouth) once a day (in the morning).  Last Dose:____________________  These Medications Were Removed and Should No Longer Be Taken  buprenorphine-naloxone (Suboxone 8 mg-2 mg sublingual film) 1 Each Sublingual (dissolve under the tongue) 3 times a day.  Stop Taking Reason: Physician Request         Patient's Final Home Medication List Upon Discharge:           acetaminophen (Tylenol Extra Strength 500 mg oral tablet) 2 Tabs Oral (given by mouth) every 4 hours as  needed for pain.  acetaminophen-oxyCODONE (Percocet 7.5/325 oral tablet) 2 Tabs Oral (given by mouth) every 4 hours as needed as needed for pain. Refills: 0., MAX DAILY DOSE OF ACETAMINOPHEN = 4000 MG  aspirin (aspirin 81 mg oral delayed release tablet) 1 Tabs Oral (given by mouth) every 12 hours. Refills: 0.  dextroamphetamine-amphetamine (Adderall 20 mg oral tablet) 1 Tabs Oral (given by mouth) 4 times a day.  venlafaxine (venlafaxine 150 mg oral capsule, extended release) 1 Capsules Oral (given by mouth) once a day (in the morning).         Comment:       ORDERS          Order Name Order Details   Discharge Patient 08/31/17 8:50:00 EDT, Discharge Home/Home Health

## 2017-08-31 NOTE — Discharge Summary (Signed)
 Inpatient Patient Summary               [facility]  [address]  [place], [place] [address]  [phone]  Patient Discharge Instructions    Name: [name] [name] [name] [name]  Current Date: 08/31/2017 15:18:10  DOB: November 09, 1965 MRN: [id number] FIN: [id number]  Patient Address: [address] [place] [address]  Patient Phone: [phone]  Primary Care Provider:  Name: [name] [name]  Phone: [phone]   Immunizations Provided:      Discharge Diagnosis: Knee osteoarthritis  Discharged To: TO, ANTICIPATED%>Home with family support  Home Treatments: TREATMENTS, ANTICIPATED%>  Devices/Equipment: EQUIPMENT REHAB%>[facility] Services: HOSPITAL SERVICES%>[facility] [phone]      Professional Skilled Services: SKILLED SERVICES%>  Special Services and Community Resources: SERV AND COMM RES, ANTICIPATED%>  Mode of Discharge Transportation: TRANSPORTATION%>  Discharge Orders          Discharge Patient 08/31/17 8:50:00 EDT, Discharge Home/Home Health        Comment:     Medications   During the course of your visit, your medication list was updated with the most current information. The details of those changes are reflected below:          New Medications  [facility] [address], [address] [place], [place] [id number], [phone]  aspirin (aspirin 81 mg oral delayed release tablet) 1 Tabs Oral (given by mouth) every 12 hours. Refills: 0.  Last Dose:____________________  Printed Prescriptions  acetaminophen-oxyCODONE (Percocet 7.5/325 oral tablet) 2 Tabs Oral (given by mouth) every 4 hours as needed as needed for pain. Refills: 0., MAX DAILY DOSE OF ACETAMINOPHEN = 4000 MG  Last Dose:____________________  Medications That Have Not Changed  Other Medications  acetaminophen (Tylenol Extra Strength 500 mg oral tablet) 2 Tabs Oral (given by mouth) every 4 hours as needed for pain.  Last Dose:____________________  dextroamphetamine-amphetamine (Adderall 20 mg oral tablet) 1  Tabs Oral (given by mouth) 4 times a day.  Last Dose:____________________  venlafaxine (venlafaxine 150 mg oral capsule, extended release) 1 Capsules Oral (given by mouth) once a day (in the morning).  Last Dose:____________________  These Medications Were Removed and Should No Longer Be Taken  buprenorphine-naloxone (Suboxone 8 mg-2 mg sublingual film) 1 Each Sublingual (dissolve under the tongue) 3 times a day.  Stop Taking Reason: Physician Request        [facility] would like to thank you for allowing us  to assist you with your healthcare needs. The following includes patient education materials and information regarding your injury/illness.    [name] [name] has been given the following list of follow-up instructions, prescriptions, and patient education materials:  Follow-up Instructions:            It is important to always keep an active list of medications available so that you can share with other providers and manage your medications appropriately. As an additional courtesy, we are also providing you with your final active medications list that you can keep with you.           acetaminophen (Tylenol Extra Strength 500 mg oral tablet) 2 Tabs Oral (given by mouth) every 4 hours as needed for pain.  acetaminophen-oxyCODONE (Percocet 7.5/325 oral tablet) 2 Tabs Oral (given by mouth) every 4 hours as needed as needed for pain. Refills: 0., MAX DAILY DOSE OF ACETAMINOPHEN = 4000 MG  aspirin (aspirin 81 mg oral delayed release tablet) 1 Tabs Oral (given by mouth) every  12 hours. Refills: 0.  dextroamphetamine-amphetamine (Adderall 20 mg oral tablet) 1 Tabs Oral (given by mouth) 4 times a day.  venlafaxine (venlafaxine 150 mg oral capsule, extended release) 1 Capsules Oral (given by mouth) once a day (in the morning).      Take only the medications listed above. Contact your doctor prior to taking any medications not on this list.      Discharge instructions, if any, will display below    Instructions for  Diet: INSTRUCTIONS FOR DIET%>   Instructions for Supplements: SUPPLEMENT INSTRUCTIONS%>   Instructions for Activity: INSTRUCTIONS FOR ACTIVITY%>   Instructions for Wound Care: INSTRUCTIONS FOR WOUND CARE%>    Medication leaflets, if any, will display below     Patient education materials, if any, will display below          IS IT A STROKE? Act FAST and Check for these signs:    FACE                         Does the face look uneven?    ARM                         Does one arm drift down?    SPEECH                    Does their speech sound strange?    TIME                         Call 9-1-1 at any sign of stroke  Heart Attack Signs  Chest discomfort: Most heart attacks involve discomfort in the center of the chest and lasts more than a few minutes, or goes away and comes back. It can feel like uncomfortable pressure, squeezing, fullness or pain.  Discomfort in upper body: Symptoms can include pain or discomfort in one or both arms, back, neck, jaw or stomach.  Shortness of breath: With or without discomfort.  Other signs: Breaking out in a cold sweat, nausea, or lightheaded.  Remember, MINUTES DO MATTER. If you experience any of these heart attack warning signs, call 9-1-1 to get immediate medical attention!     ---------------------------------------------------------------------------------------------------------------------  [facility] allows you to manage your health, view your test results, and retrieve your discharge documents from your hospital stay securely and conveniently from your computer.  To begin the enrollment process, visit [email or web address] Click on "Sign up now" under [facility].

## 2017-08-31 NOTE — Progress Notes (Signed)
Inpatient PT Daily Documentation - Text       Inpatient PT Daily Documentation Entered On:  08/31/2017 16:18 EDT    Performed On:  08/31/2017 16:02 EDT by Jovita KussmaulGADSDEN, PTA, ANTWAN D               Reason for Treatment   Subjective Statement :   # 3 Pt & Rn agreed to PT tx.     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     Candelaria CelesteGADSDEN, PTA, ANTWAN D - 08/31/2017 16:02 EDT   Review/Treatments Provided   PT Goals :   PT Short Term Goals    08/30/2017  Other PT Goal #1: supine <> sit x SBA; Initial  Other PT Goal #2: sit <> stand x CGA; Initial  Other PT Goal #3: amb x RW x CGA x 500'x reciprocal step pattern; Initial  Other PT Goal #4: amb up/down 5 steps with single rail and cane; Initial  Other PT Goal #5: I and compliant with TKA HEP; Initial     PT Plan :   Treatment Duration: 2 Performed By: Tilden DomeARTER, PT, [place] G  08/30/2017 09:46  Planned Treatments: Other: bed mobility, transfer/gait/stair training, review of HEP Performed By: Tilden DomeARTER, PT,  G  08/30/2017 09:46     Physical Therapy Orders :   Physical Therapy Additional Treatment Acute - 08/30/17 9:57:12 EDT, Other: bed mobility, transfer/gait/stair training, review of HEP, for 2 Days, Stop date 09/01/17 9:56:00 EDT, BID     Pain Present :   No actual or suspected pain   PT Therapeutic Activity,Mobility,Balance :   Yes   GADSDEN, PTA, ANTWAN D - 08/31/2017 16:02 EDT   Therapeutic Activities/Mobility/Balance   PT Therapeutic Activities Grid     Activity 1          Activity :    Functional mobility, Gait, Posture, Tolerance to upright position, Transfer training              Assist :    Contact guard assistance, Minimal assistance              Position :    Standing              Equipment :    Rolling walker                GADSDEN, PTA, ANTWAN D - 08/31/2017 16:02 EDT         Assessment   PT Impairments or Limitations :   Ambulation deficits, Balance  deficits, Endurance deficits, Pain limiting function, Range of motion deficits, Strength deficits, Transfer deficits   Barriers to Safe Discharge PT :   Medical diagnosis   Discharge Recommendations :   home vs. brief skilled rehab     PT Treatment Recommendations :   # 3 Pt standing with RW. indep on arrival. pt amb. 43000ft cga x 1 with RW, pt to PT gym up & down stairs cga x 1. Pt up stairs Left railing. pt back to room seated to chair. pt gait pace steady cueing pt x 2 on stride length. pt demo good gait cadance. pt call bell & needs at hand, pt would cont to benefit from cont PT     GADSDEN, PTA, ANTWAN D - 08/31/2017 16:02  EDT   Time Spent With Patient   PT Gait Training Units :   2 units   PT Gait Training Time :   24 minutes   PT Total Timed Code Treatment Units :   2 units   PT Total Timed Code Min :   24    PT Total Treatment Time Ac/Outp :   24    GADSDEN, PTA, Melanee Spry D - 08/31/2017 16:02 EDT

## 2017-08-31 NOTE — Nursing Note (Signed)
Medication Administration Follow Up-Text       Medication Administration Follow Up Entered On:  08/31/2017 5:41 EDT    Performed On:  08/31/2017 4:20 EDT by Wilford SportsBOULWARE, RN, SHARNICE R      Intervention Information:     acetaminophen-hydrocodone  Performed by Wilford SportsBOULWARE, RN, SHARNICE R on 08/31/2017 03:19:00 EDT       HYDROcodone-acetaminophen,2tabs  Oral,moderate pain (4-7)       Med Response   ED Medication Response :   No adverse reaction, Symptoms improved   Numeric Rating Pain Scale :   5 = Moderate pain   Pasero Opioid Induced Sedation Scale :   S = Sleep, easy to arouse   Wilford SportsBOULWARE, RN, SHARNICE R - 08/31/2017 5:41 EDT
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
## 2017-08-31 NOTE — Nursing Note (Signed)
Medication Administration Follow Up-Text       Medication Administration Follow Up Entered On:  08/31/2017 1:10 EDT    Performed On:  08/31/2017 0:04 EDT by Wilford SportsBOULWARE, RN, SHARNICE R      Intervention Information:     acetaminophen-hydrocodone  Performed by Wilford SportsBOULWARE, RN, SHARNICE R on 08/30/2017 23:04:00 EDT       HYDROcodone-acetaminophen,2tabs  Oral,moderate pain (4-7)       Med Response   ED Medication Response :   No adverse reaction, Symptoms improved   Numeric Rating Pain Scale :   6   Pasero Opioid Induced Sedation Scale :   1 = Awake and alert   BOULWARE, RN, SHARNICE R - 08/31/2017 1:10 EDT

## 2021-09-01 MED FILL — FLOWFLEX COVID-19 AG HOME TES  KIT: 2 days supply | Qty: 2 | Fill #0 | Status: AC

## 2021-10-09 ENCOUNTER — Encounter

## 2021-10-13 ENCOUNTER — Inpatient Hospital Stay: Admit: 2021-10-13 | Payer: MEDICAID

## 2021-10-13 DIAGNOSIS — M545 Low back pain, unspecified: Secondary | ICD-10-CM

## 2021-10-16 ENCOUNTER — Inpatient Hospital Stay: Payer: MEDICAID

## 2021-11-24 ENCOUNTER — Encounter

## 2021-12-02 ENCOUNTER — Inpatient Hospital Stay: Payer: MEDICAID

## 2021-12-04 ENCOUNTER — Inpatient Hospital Stay: Admit: 2021-12-04 | Payer: MEDICAID

## 2021-12-04 DIAGNOSIS — M5416 Radiculopathy, lumbar region: Secondary | ICD-10-CM

## 2021-12-11 ENCOUNTER — Inpatient Hospital Stay: Payer: MEDICAID

## 2021-12-11 DIAGNOSIS — M5459 Other low back pain: Secondary | ICD-10-CM

## 2021-12-15 ENCOUNTER — Inpatient Hospital Stay: Admit: 2021-12-15 | Payer: MEDICAID
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
## 2021-12-15 NOTE — Telephone Encounter (Signed)
Pt not yet in office for appt registration. LVM to see if pt running late or needs to r/s

## 2021-12-31 ENCOUNTER — Inpatient Hospital Stay: Admit: 2021-12-31 | Payer: MEDICAID
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
## 2022-01-05 ENCOUNTER — Inpatient Hospital Stay: Payer: MEDICAID

## 2022-01-05 NOTE — Telephone Encounter (Signed)
NS/NC

## 2022-01-06 NOTE — Telephone Encounter (Signed)
pt had appt at 430p with [facility], & arrived at 501p. pt was turned away due to not having any available therapists that could fit him in.

## 2022-01-07 ENCOUNTER — Inpatient Hospital Stay: Payer: MEDICAID

## 2022-01-07 NOTE — Telephone Encounter (Signed)
NS/NC #2 - LVM reminding patient of next appt and attendancy policy

## 2022-01-12 ENCOUNTER — Inpatient Hospital Stay: Payer: MEDICAID

## 2022-01-12 NOTE — Telephone Encounter (Signed)
NS/NC - Called and spoke to patient regarding D/C from skilled PT per clinic's attendance policy, who kindly acknowledged understanding.  All visits cancelled and patient discharged.
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
## 2022-05-06 NOTE — Progress Notes (Signed)
Formatting of this note is different from the original.  Comprehensive Evaluation- Service Date: 05/06/22  Assessment & Plan     Pre-op  - Scheduled for surgery as per HPI  - History of vomiting with prior surgeries, met with anesthesiology to discuss  - No recent health problems, other prior complications from anesthesia, or exercise intolerance  - CBC, BMP, PT/INR, PTT done yesterday at [facility] normal as below  - EKG done yesterday at [facility] with baseline sinus bradycardia with rate 54, borderline L axis deviation, otherwise unremarkable as below  - Defer cardiology evaluation as asymptomatic, normal BP  - Cleared for surgery as scheduled    Obesity, elevated BP  - Normal on repeat, asymptomatic  - Reviewed prior labs, summarized in HPI  - Continue off medication as controlled on repeat and typically normal  - Reinforced importance of healthy diet and exercise in maintaining general health  - Advised to call office or present to ER if CP, SOB, HA, LE edema, or vision change  - Will follow-up with PCP for further management    Chief Complaint       Patient presents with    PRE-OP CLEARANCE     History of Present Illness   The history is provided by the patient.     Here for pre-op evaluation    Pre-op  - Scheduled for T10-Ilium PSIF, L1-S1 SPOs, L4-5 and L5-S1 TLIFs, and T8-T10 VersaTie with Dr. Solon Palm 05/10/22  - Has had significant nausea and vomiting with prior anesthesia, no other prior complications with anesthesia  - No recent illnesses  - Has not been able to be as active due to pain, but does exercise as he is able to  - No HA, CP, SOB, LE edema, vision change  - BMP, CBC, PT/INR/PTT done 05/05/22 all normal  - EKG at that time showed sinus bradycardia with rate 54, borderline L axis deviation, otherwise normal intervals and no evidence of ischemia    Elevated BP  - BP is typically well controlled  - Has been stressed trying to get here, and worried about the surgery  - No HA, CP, SOB, LE edema, vision  change    Review of Systems   Review of Systems   Constitutional:  Negative for chills, fatigue, fever and unexpected weight change.   HENT:  Negative for congestion, rhinorrhea and sore throat.    Respiratory:  Negative for cough, shortness of breath and wheezing.    Cardiovascular:  Negative for chest pain, palpitations and leg swelling.   Skin:  Negative for rash.   Neurological:  Negative for dizziness, weakness, numbness and headaches.   All other systems reviewed and are negative.    Home Medications     Outpatient Medications Marked as Taking for the 05/06/22 encounter (Office Visit) with Metro Kung, MD   Medication Sig Dispense Refill    ALPRAZolam (XANAX) 1 mg PO TABS TAKE 1 TO 2 TABLET BY MOUTH AT BEDTIME AS NEEDED FOR SLEEP      buprenorphine-naloxone (SUBOXONE) 8-2 mg SL SUBL TAKE 1 TABLET UNDER THE TONGUE TWICE A DAY AS DIRECTED       Allergies   No Known Allergies    Past Surgical History   No past surgical history on file.     Past Medical History   No past medical history on file.    Family History   No family history on file.    Social History     Social  History     Occupational History    Not on file   Tobacco Use    Smoking status: Never    Smokeless tobacco: Never   Vaping Use    Vaping Use: Never used   Substance and Sexual Activity    Alcohol use: Never    Drug use: Never    Sexual activity: Not on file     E-Cigarette Use: Never User   Start Date:    Quit Date:    Passive Exposure:    Counseling Given:    Comments:    Nicotine:    THC:    CBD:     Flavoring:    Other:       The patient's medical, family, and social history (including tobacco usage) were reviewed and updated as appropriate.        Tobacco History reviewed:  Social History     Tobacco Use   Smoking Status Never   Smokeless Tobacco Never     Counseling given: No    Physical Exam   Vital Signs: BP 125/78   Pulse 54   Temp 97.3 F (36.3 C) (Temporal)   Ht _0  (1.88 m)   Wt 112.3 kg (247 lb 9.2 oz)   SpO2 100%   BMI  31.79 kg/m      Physical Exam  Vitals and nursing note reviewed.   Constitutional:       Appearance: Normal appearance.   HENT:      Head: Normocephalic and atraumatic.      Right Ear: Tympanic membrane, ear canal and external ear normal.      Left Ear: Tympanic membrane, ear canal and external ear normal.      Nose: No rhinorrhea.      Comments: Mild nasal congestion, attributes to cold weather     Mouth/Throat:      Mouth: Mucous membranes are moist.      Pharynx: Oropharynx is clear.   Eyes:      General: No scleral icterus.        Right eye: No discharge.         Left eye: No discharge.      Extraocular Movements: Extraocular movements intact.      Conjunctiva/sclera: Conjunctivae normal.      Pupils: Pupils are equal, round, and reactive to light.   Cardiovascular:      Rate and Rhythm: Normal rate and regular rhythm.      Heart sounds: Normal heart sounds.   Pulmonary:      Effort: Pulmonary effort is normal.      Breath sounds: Normal breath sounds.   Musculoskeletal:      Cervical back: Normal range of motion and neck supple.   Skin:     General: Skin is warm and dry.   Neurological:      General: No focal deficit present.      Mental Status: He is alert and oriented to person, place, and time.     Recent Results & Studies   I have reviewed pertinent labs.    Medical Decision Making   Complexity  MDM RECORD REVIEW: labs, EKG. Plan of care was discussed with the patient.     I have reviewed information entered by the clinical staff and/or patient and verified it as accurate or edited where necessary.    Signature:  Metro Kung, MD  [place]  Dept: [phone]  Dept Fax: [phone]  Electronically signed by Metro Kung, MD at 05/06/2022  9:47 AM EST

## 2022-05-25 ENCOUNTER — Inpatient Hospital Stay: Admit: 2022-05-25 | Discharge: 2022-05-25 | Disposition: A | Discharge status: Home / Self Care | Payer: MEDICAID

## 2022-05-25 DIAGNOSIS — Z4802 Encounter for removal of sutures: Secondary | ICD-10-CM

## 2022-05-25 NOTE — Telephone Encounter (Signed)
Formatting of this note might be different from the original.  Called patient, no answer, LVM stating he will need to return to [facility] for staple removal.     Altha Harm, LPN    Electronically signed by Altha Harm, LPN at 01/75/1025 85:27 AM EST

## 2022-05-25 NOTE — ED Notes (Signed)
Paperwork read with patient making note of where to pick up prescriptions (if applicable).     IV taken out (if applicable).     Armband removed and disposed of in shredder.     Patient has no further questions at this time.     Will discharge from system.

## 2022-05-25 NOTE — Telephone Encounter (Signed)
Formatting of this note might be different from the original.  Patient was referred by Dr. Elsworth Soho to [facility] neurosurgery. He had the surgery with Dr. Tamala Julian at [facility] on 05/10/22. Patient was wondering if Dr. Bari Mantis clinical team would be willing to take the stiches out to avoid a 5 hour roundtrip car ride to [place]. Please advise with patient at [phone].    Electronically signed by Arna Snipe at 05/25/2022 11:36 AM EST

## 2022-05-25 NOTE — Telephone Encounter (Signed)
Formatting of this note might be different from the original.  All care during the patient's postoperative period and incision will have to go through [facility].  Electronically signed by Emelda Brothers, PA at 05/25/2022 11:03 AM EST

## 2022-05-25 NOTE — Discharge Instructions (Signed)
Thank you for allowing me to take care of you today.    Please follow-up with your surgeon as discussed.    Please continue to care for your wound as instructed by your surgeon.    Please not hesitate to return to the emergency department with any new or worsening symptoms such as fever, chills, generalized weakness, increased redness, increased swelling, drainage, or increased pain at the site of your wound.

## 2022-05-25 NOTE — ED Triage Notes (Signed)
Patient reports that he had back surgery on 05/10/2022 [facility] told hm to come here and he could have the stapes removed.

## 2022-05-25 NOTE — ED Provider Notes (Cosign Needed)
[facility] EMERGENCY DEPT  EMERGENCY DEPARTMENT ENCOUNTER       Pt Name: [name] Woods  MRN: [id number]  Birthdate 08-14-1965  Date of evaluation: 05/25/2022  PCP: No, Pcp  Note Started: 4:24 PM 05/25/22     CHIEF COMPLAINT       Chief Complaint   Patient presents with    Suture / Staple Removal        HISTORY OF PRESENT ILLNESS: 1 or more elements      History From: Patient  HPI Limitations: None  Chronic Conditions: Recent spine fusion.  Social Determinants affecting Dx or Tx: None    [name] Woods is a 56 y.o. male who presents to ED c/o need for staple removal on his back.  On the fourth of this month he had a spinal fusion procedure at [facility].  He was scheduled to have the staples removed tomorrow in the clinic but did not want to drive up there.  He spoke with his surgeon about this who was agreeable to allow him to come here to have the sutures removed.  He denies any pain fever chills or other systemic signs of illness.  Denies any drainage from the site.     Nursing Notes were all reviewed and agreed with or any disagreements were addressed in the HPI.      PHYSICAL EXAM      Vitals:    05/25/22 1531   BP: 116/67   Pulse: 98   Resp: 16   Temp: 98.1 F (36.7 C)   TempSrc: Oral   SpO2: 100%   Weight: 99.8 kg (220 lb)   Height: 1.88 m (6\' 2" )     Physical Exam  Constitutional:       Appearance: Normal appearance.   HENT:      Head: Normocephalic and atraumatic.      Mouth/Throat:      Mouth: Mucous membranes are dry.   Pulmonary:      Effort: Pulmonary effort is normal.      Breath sounds: Normal breath sounds.   Skin:     General: Skin is warm and dry.             Comments: Mild erythema at the top portion of the healed wound.  49 staples counted.  No active drainage.  Well-approximated.  Appears healed   Neurological:      Mental Status: He is alert.              EMERGENCY DEPARTMENT COURSE and DIFFERENTIAL DIAGNOSIS/MDM   Vitals:    Vitals:    05/25/22 1531   BP: 116/67   Pulse: 98   Resp: 16   Temp: 98.1 F (36.7 C)    TempSrc: Oral   SpO2: 100%   Weight: 99.8 kg (220 lb)   Height: 1.88 m (6\' 2" )       Patient was given the following medications:  Medications - No data to display        Records Reviewed (source and summary): Nursing notes.        ED COURSE       Medial Decision Making:  DDX: Postop infection, suture removal, staple removal, wound dehiscence, others    56 y.o. male  In evaluation of the above differential diagnosis, consideration was given to the following tests and treatments: Well-appearing male presents for staple removal status post spinal fusion 12 4.  Has his repeat visit January 16 for follow-up.  Was instructed by his surgeon at  [facility] to come here to have his staples removed.    I have assessed the wound.  It does not appear to have any dehiscence.  There is no active drainage.  There is no significant erythema.  I do not think it is infected.  I believe that the staples can come out at this point.     49 staples removed without complications.     Bacitracin and dressing applied to wound.     Instructed to follow-up with his surgeon.      I discussed each of these tests and considerations with the patient. They agree with the plan of discharge and outpatient follow-up.      PAST HISTORY     Past Medical History:  Past Medical History:   Diagnosis Date    Fall        Past Surgical History:  No past surgical history on file.    Family History:  No family history on file.    Social History:  Social History     Socioeconomic History    Marital status: Divorced   Tobacco Use    Smoking status: Never   Substance and Sexual Activity    Alcohol use: Not Currently    Drug use: Not Currently       Allergies:  No Known Allergies    CURRENT MEDICATIONS      No current facility-administered medications for this encounter.     No current outpatient medications on file.          DIAGNOSTIC RESULTS   LABS:    No results found for this or any previous visit (from the past 24 hour(s)).    Labs Reviewed - No data to  display      EKG: When ordered, EKG's are interpreted by the Emergency Department Provider in the absence of a cardiologist.  Please see their note for interpretation of EKG.   None  Read by me.      RADIOLOGY:  Non-plain film images such as CT, Ultrasound and MRI are read by the radiologist. Plain radiographic images are visualized and preliminarily interpreted by the ED Provider with the below findings:     None  Read by me, pending review by radiologist.     Interpretation per the Radiologist below, if available at the time of this note:  No orders to display           PROCEDURES   Unless otherwise noted below, none  Procedures         CRITICAL CARE TIME   None      FINAL IMPRESSION     1. Encounter for staple removal            DISPOSITION/PLAN   DISPOSITION Decision To Discharge 05/25/2022 04:22:58 PM           PATIENT REFERRED TO:  No follow-up provider specified.       DISCHARGE MEDICATIONS:     Medication List      You have not been prescribed any medications.            I am the Primary Clinician of Record.       (Please note that parts of this dictation were completed with voice recognition software. Quite often unanticipated grammatical, syntax, homophones, and other interpretive errors are inadvertently transcribed by the computer software. Please disregards these errors. Please excuse any errors that have escaped final proofreading.)       [name] Woods, [name] Woods, ACNP  05/25/22 1624

## 2022-07-13 ENCOUNTER — Inpatient Hospital Stay: Admit: 2022-07-13 | Payer: MEDICAID

## 2022-07-13 DIAGNOSIS — M5459 Other low back pain: Secondary | ICD-10-CM

## 2022-07-13 NOTE — Progress Notes (Signed)
[facility]  [address] [place] [address]  Ph [phone]  Fx [phone]    Plan of Care/ Statement of Necessity for Physical Therapy Services      Patient name: [name] Woods Start of Care: 07/13/2022   Referral source: Elyn Peers, APRN - * DOB: November 17, 1965    Medical Diagnosis: Other low back pain [M54.59]   Onset Date:05/10/2022    Treatment Diagnosis: M54.59  OTHER LOWER BACK PAIN     Prior Hospitalization: see medical history Provider#: [id number]   Medications: Verified on Patient summary List   Comorbidities:  arthroscopic left knee surgery, left TKA, osteotomy on right leg, spinal injections, spinal nerve root ablations, right RTC tear, 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  Assessment/ key information: 57 yo male who presents to [facility] with c/o back pain. Patient is s/p T10-pelvic fusion on 05/10/22. Pt reports he fell down the stairs in 2018 and he had multiple fractures and has needed this procedure for a while. Pt is now s/p T10-Pelvic fusion. He reports he has had 2-3 falls in first couple of weeks after surgery without injuries. He also reports two days ago he attempted to lay down flat on the floor and had excruciating pain and muscle spasm- he states he has sent a MyChart msg to MD and hasn't heard anything back yet but that the pain has started to subside from that event. Current symptoms/Complaints: 0/10 at best with walking or laying in reclined sleep number bed; 10/10 at worst with laying flat and prolonged standing. Patient demonstrates decreased ROM, decreased strength, impaired posture, impaired gait mechancis, pain and decreased functional mobility tolerance.     Patient will continue to benefit from skilled PT services to modify and progress therapeutic interventions,  address functional mobility deficits, address ROM deficits, address strength deficits, analyze and address soft tissue restrictions, analyze and cue movement patterns, analyze and modify body mechanics/ergonomics, assess and modify postural abnormalities, address imbalance/dizziness, and instruct in home and community integration to attain remaining goals.    Evaluation Complexity HistoryMEDIUM  Complexity : 1-2 comorbidities / personal factors will impact the outcome/ POC  ; Examination MEDIUM Complexity : 3 Standardized tests and measures addressin body structure, function, activity limitation and / or participation in recreation  ;Presentation MEDIUM Complexity : Evolving with changing characteristics  ;Clinical Decision Making MEDIUM Complexity : FOTO score of 26-74 FOTO score = an established functional score where 100 = no disability  Overall Complexity Rating: MEDIUM  Problem List: pain affecting function, decrease ROM, decrease strength, edema affection function, impaired gait/balance, decrease ADL/functional abilities, decrease activity tolerance, decrease flexibility/joint mobility, and decrease transfer abilities    Treatment Plan may include any combination of the following: 97110 Therapeutic Exercise, 97112 Neuromuscular Re-Education, 97140 Manual Therapy, 97530 Therapeutic Activity, 97535 Self Care/Home Management, 97014 Electrical Stim unattended, and 97116 Gait Training  Vasopnuematic compression justification:  Per bilateral girth measures taken and listed above the edema is considered significant and having an impact on the patient's strength, balance, gait, transfers, self care, and ADLs  Patient / Family readiness to learn indicated by: asking questions, trying to perform skills, interest, return verbalization , and return demonstration   Persons(s) to be included in education: patient (P)  Barriers to  Learning/Limitations: None  Measures taken if barriers to learning present: NA  Patient Goal  (s): "to rehab and learn what my capabilities are for additional exercise and what I can do to try to not lose all of my muscle "  Patient Self Reported Health Status: good  Rehabilitation Potential: good    Short Term Goals: To be accomplished in 6 treatments:  Patient will be independent and compliant with HEP to progress toward goals and restore functional mobility.   Eval Status: will issue at followup visit 1 or 2     Patient will improve FOTO score by 5 points to improve functional tolerance for exercise.   Eval Status: FOTO 45  FOTO score = an established functional score where 100 = no disability     Patient will improve pain in back to 5/10 at worst to improve ADL and work duty tolerance and restore prior level of function.  Eval Status: 10/10 at worst     Pt will have 4+/5 bilateral LE and core strength to return to goals of completing work duties without increased pain.  Eval Status:     Left(0-5) Right (0-5) N/T   Hip Flexion (L1,2) 5 5 []     Knee Extension (L3,4) 5 5 []     Ankle Dorsiflexion (L4) 5 5 []     Ankle Plantarflexion (S1) 5 5 []     Knee Flexion (S1,2) 5 5 []     Abdominals     [x]     Paraspinals     [x]     Back Rotators     [x]     Gluteus Maximus 4- 4- []     Hip Abduction 4 4 []           Pt will have painfree hip flexibility WFL to aid in functional mechanics for ambulation/ADLs.  Eval Status: Hip:         Corky Sox:              [x]  Right    [x]  Left    [x]  +    []  -                           Scour:              []  Right    []  Left    []  +    [x]  -                           Piriformis:        [x]  Right    [x]  Left    [x]  +    []  -                           Thomas:          [x]  Right    [x]  Left    [x]  +    []  -                           Obers:             [x]  Right    [x]  Left    [x]  +    []  -      Long Term Goals: To be accomplished in 24 treatments:  Patient will improve FOTO score by 10 points to improve functional tolerance for housework/work duty  performance.  Eval Status: FOTO 45  FOTO score  = an established functional score where 100 = no disability     2.   Pt will have 5/5 bilateral LE and core strength to return to goals of improved pain with walking and prolonged standing.  Eval Status:     Left(0-5) Right (0-5) N/T   Hip Flexion (L1,2) 5 5 []     Knee Extension (L3,4) 5 5 []     Ankle Dorsiflexion (L4) 5 5 []     Ankle Plantarflexion (S1) 5 5 []     Knee Flexion (S1,2) 5 5 []     Abdominals     [x]     Paraspinals     [x]     Back Rotators     [x]     Gluteus Maximus 4- 4- []     Hip Abduction 4 4 []           3.   Patient will improve pain in back to 0/10 at worst to improve ADL and work duty tolerance and return to working out without pain.  Eval Status: 10/10 at worst    Frequency / Duration: Patient to be seen 2 times per week for 24 treatments    Patient/ Caregiver education and instruction: Diagnosis, prognosis, self care, activity modification, brace/ splint application, and exercises     [x]   Plan of care has been reviewed with PTA    Certification Period: NA  Kandi Brusseau, PT 07/13/2022 2:15 PM  ____________________________________________________________________  I certify that the above Therapy Services are being furnished while the patient is under my care. I agree with the treatment plan and certify that this therapy is necessary.    17 Signature:____________________________Date:_________TIME:________                                      Elyn Peers, APRN - *  Insurance: [place] / Plan: [place] / Product Type: *No Product type* /      ** Signature, Date and Time must be completed for valid certification **    [facility]  [address] [place] [address]  Ph [phone]  Fx [phone]

## 2022-07-13 NOTE — Progress Notes (Signed)
PT DAILY TREATMENT NOTE/ LUMBAR EVAL 10-18      Patient Name: [name] Woods    Date: 07/13/2022    DOB: [birth year]  Insurance: [place] / [place] ANTHE[place] HE[employer]KEEPERS PLUS / Product Type: *No Product type* /      Patient DOB verified yes     Visit #   Current / Total 1 24   Time   In / Out 1240 1310     TREATMENT AREA =  Other low back pain [M54.59]      SUBJECTIVE  Pain Level (0-10 scale): 5/10  [x] constant [] intermittent [] improving [] worsening [] no change since onset    Any medication changes, allergies to medications, adverse drug reactions, diagnosis change, or new procedure performed?: [x]  No    []  Yes (see summary sheet for update)  Subjective functional status/changes:     PLOF: functionally independent, no AD, active lifestyle  Limitations to PLOF: pain, weakness, radiculopathy, orthopedic restrictions  Mechanism of Injury: Pt reports he fell down the stairs in 2018 and he had multiple fractures and has needed this procedure for a while. Pt is now s/p T10-Pelvic fusion. He reports he has had 2-3 falls in first couple of weeks after surgery without injuries. He also reports two days ago he attempted to lay down flat on the floor and had excruciating pain and muscle spasm- he states he has sent a MyChart msg to MD and hasn't heard anything back yet but that the pain has started to subside from that event.  Current symptoms/Complaints: 0/10 at best with walking or laying in reclined sleep number bed; 10/10 at worst with laying flat and prolonged standing  Previous Treatment/Compliance: pt underwent prior PT in 2023 for low back pain with radicular symptoms following a fall down stairs in 2018 that resulted in multiple ortho and lumbar fractures; Thoraco-lumbar-pelvic (T10-Pelvis) fusion performed by Dr Tamala Julian at [facility] on 05/10/22; taking tylenol, has had HHPT x2 visits  PMHx/Surgical Hx: arthroscopic left knee surgery, left TKA, osteotomy on right leg, spinal injections, spinal nerve  root ablations, right RTC tear, 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  Work Hx: works full time as Therapist, 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 as well as extensive walking  Living Situation: one story home with 3 STE and bilateral HR, lives with girlfriend  Pt Goals: "to rehab and learn what my capabilities are for additional exercise and what I can do to try to not lose all of my muscle"  Barriers: [x] pain [] financial [x] time [] transportation [] other  Motivation: good  Substance use: [] Alcohol [] Tobacco [] other:   Cognition: A & O x 3        OBJECTIVE      30 min [x] Eval - untimed                        [x]   Patient Education billed concurrently with other procedures   [x]  Review HEP    []  Progressed/Changed HEP, detail:    []  Other detail:       General Evaluation    Physical Therapy Evaluation - Lumbar Spine    OBJECTIVE  Posture:  Lateral Shift: []  Right    []  Left     []  +  [x]  -  Kyphosis: [x]  Increased []  Decreased   []   WNL  Lordosis:  []  Increased [x]  Decreased   []  WNL  Pelvic symmetry: []  WNL    [x]  Other: fused  Gait:  []  Normal     [x]  Abnormal: antalgic on right LE, decreased cadence and stride length    Active Movements: []  N/A   []  Too acute   []  Other:  ROM % AROM Comments:pain, area   Forward flexion 40-60  Fixed due to instrumentation   Extension 20-30  Fixed due to instrumentation   SideBend right 20-30  Fixed due to instrumentation   SideBend left 20-30  Fixed due to instrumentation   Rotation right 5-10  Fixed due to instrumentation   Rotation left 5-10  Fixed due to instrumentation     Dural Mobility:  SLR Supine: []  Right    []  Left   []  +    [x]  -  @ (degrees):   Slump Test: []  Right    []  Left   []  +    [x]  -  @ (degrees):     Palpation  [x]  Min  []  Mod  []  Severe    Location: hypersensitivity over incision    Strength   Left(0-5) Right (0-5) N/T   Hip Flexion (L1,2) 5 5 []    Knee Extension (L3,4) 5 5 []    Ankle Dorsiflexion (L4) 5 5 []    Ankle Plantarflexion  (S1) 5 5 []    Knee Flexion (S1,2) 5 5 []    Abdominals   [x]    Paraspinals   [x]    Back Rotators   [x]    Gluteus Maximus 4- 4- []    Hip Abduction 4 4 []      Special Tests       Hip: Corky Sox:  [x]  Right    [x]  Left    [x]  +    []  -     Scour:  []  Right    []  Left    []  +    [x]  -     Piriformis: [x]  Right    [x]  Left    [x]  +    []  -     Thomas: [x]  Right    [x]  Left    [x]  +    []  -     Obers:  [x]  Right    [x]  Left    [x]  +    []  -     Other Tests / Comments:        Pain Level (0-10 scale) post treatment: 5/10    ASSESSMENT/Changes in Function: 57 yo male who [facility] to [facility] with c/o back pain. Patient is s/p T10-pelvic fusion on 05/10/22. Pt reports he fell down the stairs in 2018 and he had multiple fractures and has needed this procedure for a while. Pt is now s/p T10-Pelvic fusion. He reports he has had 2-3 falls in first couple of weeks after surgery without injuries. He also reports two days ago he attempted to lay down flat on the floor and had excruciating pain and muscle spasm- he states he has sent a MyChart msg to MD and hasn't heard anything back yet but that the pain has started to subside from that event. Current symptoms/Complaints: 0/10 at best with walking or laying in reclined sleep number bed; 10/10 at worst with laying flat and prolonged standing. Patient demonstrates decreased ROM, decreased strength, impaired posture, impaired gait mechancis, pain and decreased functional mobility tolerance.    Patient will continue to benefit from skilled PT services to modify and progress therapeutic interventions, address functional mobility deficits, address ROM deficits, address strength deficits, analyze and address soft tissue restrictions, analyze and cue movement patterns, analyze and  modify body mechanics/ergonomics, assess and modify postural abnormalities, address imbalance/dizziness, and instruct in home and community integration to attain remaining goals.     [x]   See Plan of Care  []   See  progress note/recertification  []   See Discharge Summary         Progress towards goals / Updated goals:  Short Term Goals: To be accomplished in 6 treatments:  Patient will be independent and compliant with HEP to progress toward goals and restore functional mobility.   Eval Status: will issue at followup visit 1 or 2    Patient will improve FOTO score by 5 points to improve functional tolerance for exercise.   Eval Status: FOTO 45  FOTO score = an established functional score where 100 = no disability    Patient will improve pain in back to 5/10 at worst to improve ADL and work duty tolerance and restore prior level of function.  Eval Status: 10/10 at worst    Pt will have 4+/5 bilateral LE and core strength to return to goals of completing work duties without increased pain.  Eval Status:    Left(0-5) Right (0-5) N/T   Hip Flexion (L1,2) 5 5 []    Knee Extension (L3,4) 5 5 []    Ankle Dorsiflexion (L4) 5 5 []    Ankle Plantarflexion (S1) 5 5 []    Knee Flexion (S1,2) 5 5 []    Abdominals   [x]    Paraspinals   [x]    Back Rotators   [x]    Gluteus Maximus 4- 4- []    Hip Abduction 4 4 []        Pt will have painfree hip flexibility WFL to aid in functional mechanics for ambulation/ADLs.  Eval Status: Hip: :  [x]  Right    [x]  Left    [x]  +    []  -     Scour:  []  Right    []  Left    []  +    [x]  -     Piriformis: [x]  Right    [x]  Left    [x]  +    []  -     Thomas: [x]  Right    [x]  Left    [x]  +    []  -     Obers:  [x]  Right    [x]  Left    [x]  +    []  -     Long Term Goals: To be accomplished in 24 treatments:  Patient will improve FOTO score by 10 points to improve functional tolerance for housework/work duty performance.  Eval Status: FOTO 45  FOTO score = an established functional score where 100 = no disability    2.   Pt will have 5/5 bilateral LE and core strength to return to goals of improved pain with walking and prolonged standing.  Eval Status:    Left(0-5) Right (0-5) N/T   Hip Flexion (L1,2) 5 5 []    Knee  Extension (L3,4) 5 5 []    Ankle Dorsiflexion (L4) 5 5 []    Ankle Plantarflexion (S1) 5 5 []    Knee Flexion (S1,2) 5 5 []    Abdominals   [x]    Paraspinals   [x]    Back Rotators   [x]    Gluteus Maximus 4- 4- []    Hip Abduction 4 4 []        3.   Patient will improve pain in back to 0/10 at worst to improve ADL and work duty tolerance and return to working out without pain.  Eval Status: 10/10  at [place]  [x]   Upgrade activities as tolerated     [x]   Continue plan of care  [x]   Update interventions per flow sheet       []   Discharge due to:_  []   Other:_      Loudon Krakow, PT 07/13/2022  12:02 PM

## 2022-07-15 ENCOUNTER — Inpatient Hospital Stay: Admit: 2022-07-15 | Payer: MEDICAID

## 2022-07-15 NOTE — Progress Notes (Signed)
PHYSICAL / OCCUPATIONAL THERAPY - DAILY TREATMENT NOTE     Patient Name: [name] Woods    Date: 07/15/2022    DOB: [birth year]  Insurance: [place] / Plan: [place] ANTHEM HEALTHKEEPERS PLUS / Product Type: *No Product type* /      Patient DOB verified Yes     Visit #   Current / Total 2 24   Time   In / Out 11:30 12:13   Pain   In / Out 4 0   Subjective Functional Status/Changes: Patient reports he knows he overdoes exercises and is anxious to get back to exercising with resistance bands to help his left arm stay strong and mobile.     Changes to:  Allergies, Med Hx, Sx Hx?   no       TREATMENT AREA =  Other low back pain [M54.59]    OBJECTIVE    Therapeutic Procedures:  Tx Min Billable or 1:1 Min (if diff from Tx Min) Procedure, Rationale, Specifics   14  97110 Therapeutic Exercise (timed):  increase ROM, strength, coordination, balance, and proprioception to improve patient's ability to progress to PLOF and address remaining functional goals. (see flow sheet as applicable)    Details if applicable:       16  16109 Neuromuscular Re-Education (timed):  improve balance, coordination, kinesthetic sense, posture, core stability and proprioception to improve patient's ability to develop conscious control of individual muscles and awareness of position of extremities in order to progress to PLOF and address remaining functional goals. (see flow sheet as applicable)    Details if applicable:     13  60454 Therapeutic Activity (timed):  use of dynamic activities replicating functional movements to increase ROM, strength, coordination, balance, and proprioception in order to improve patient's ability to progress to PLOF and address remaining functional goals.  (see flow sheet as applicable)     Details if applicable:           Details if applicable:            Details if applicable:     69  MC BC Totals Reminder: bill using total billable min of TIMED therapeutic procedures (example: do not include dry  needle or estim unattended, both untimed codes, in totals to left)  8-22 min = 1 unit; 23-37 min = 2 units; 38-52 min = 3 units; 53-67 min = 4 units; 68-82 min = 5 units   Total Total     TOTAL TREATMENT TIME:        43     [x]   Patient Education billed concurrently with other procedures   [x]  Review HEP    []  Progressed/Changed HEP, detail:    []  Other detail:       Objective Information/Functional Measures/Assessment    Patient Presentation:  Patient presents 20 minutes late to session  Assessment for treatment and intervention:    Patient demonstrates tendency to over contract durign all exercises, requiring cuing for sustainable, submax contractions that are consistent  Initially contracts rectus abdominis for TA, but able to contract TA with training and reports "burning" of muscle  Able to complete hooklying coordination and TA exercises without increase in pain  Able to complete standing exercises with cues for TA and decreased speed  HEP issued  Suggestions for next visit:  Strengthen as tolerated  Will need continuing education/supervision to grade activity appropriately to prevent re-injury/pain  Patient tolerated today's session well with less pain at end.  Patient is making good progress towards goals and will benefit from continued therapy to achieve goals and maximize function/restore PLOF.       Patient will continue to benefit from skilled PT / OT services to modify and progress therapeutic interventions, analyze and address functional mobility deficits, analyze and address ROM deficits, analyze and address strength deficits, analyze and address soft tissue restrictions, analyze and cue for proper movement patterns, analyze and modify for postural abnormalities, analyze and address imbalance/dizziness, and instruct in home and community integration to address functional deficits and attain remaining goals.    Progress toward goals / Updated goals:  []   See Progress Note/Recertification    Short Term  Goals: To be accomplished in 6 treatments:  Patient will be independent and compliant with HEP to progress toward goals and restore functional mobility.   Eval Status: will issue at followup visit 1 or 2  Current: 07/15/22: HEP issued this session     Patient will improve FOTO score by 5 points to improve functional tolerance for exercise.   Eval Status: FOTO 45  FOTO score = an established functional score where 100 = no disability     Patient will improve pain in back to 5/10 at worst to improve ADL and work duty tolerance and restore prior level of function.  Eval Status: 10/10 at worst     Pt will have 4+/5 bilateral LE and core strength to return to goals of completing work duties without increased pain.  Eval Status:     Left(0-5) Right (0-5) N/T   Hip Flexion (L1,2) 5 5 []     Knee Extension (L3,4) 5 5 []     Ankle Dorsiflexion (L4) 5 5 []     Ankle Plantarflexion (S1) 5 5 []     Knee Flexion (S1,2) 5 5 []     Abdominals     [x]     Paraspinals     [x]     Back Rotators     [x]     Gluteus Maximus 4- 4- []     Hip Abduction 4 4 []           Pt will have painfree hip flexibility WFL to aid in functional mechanics for ambulation/ADLs.  Eval Status: Hip:         09/13/22:              [x]  Right    [x]  Left    [x]  +    []  -                           Scour:              []  Right    []  Left    []  +    [x]  -                           Piriformis:        [x]  Right    [x]  Left    [x]  +    []  -                           Thomas:          [x]  Right    [x]  Left    [x]  +    []  -  Obers:             [x]  Right    [x]  Left    [x]  +    []  -      Long Term Goals: To be accomplished in 24 treatments:  Patient will improve FOTO score by 10 points to improve functional tolerance for housework/work duty performance.  Eval Status: FOTO 45  FOTO score = an established functional score where 100 = no disability     2.   Pt will have 5/5 bilateral LE and core strength to return to goals of improved pain with walking and  prolonged standing.  Eval Status:     Left(0-5) Right (0-5) N/T   Hip Flexion (L1,2) 5 5 []     Knee Extension (L3,4) 5 5 []     Ankle Dorsiflexion (L4) 5 5 []     Ankle Plantarflexion (S1) 5 5 []     Knee Flexion (S1,2) 5 5 []     Abdominals     [x]     Paraspinals     [x]     Back Rotators     [x]     Gluteus Maximus 4- 4- []     Hip Abduction 4 4 []           3.   Patient will improve pain in back to 0/10 at worst to improve ADL and work duty tolerance and return to working out without pain.  Eval Status: 10/10 at worst    PLAN  Yes  Continue plan of care  []   Upgrade activities as tolerated  []   Discharge due to :  []   Other:    Floyde Parkins, PT    07/15/2022    7:37 AM    Future Appointments   Date Time Provider [place]   07/15/2022 11:10 AM Floyde Parkins, PT [facility]   07/20/2022  9:50 AM Jethro Bastos, PTA [facility]   07/22/2022  1:10 PM Jethro Bastos, PTA [facility]   07/27/2022  2:30 PM Blase Mess [facility]   07/29/2022 10:30 AM Floyde Parkins, PT [facility]   08/03/2022 11:10 AM Jethro Bastos, PTA [facility]   08/05/2022 10:30 AM Jethro Bastos, PTA [facility]   08/10/2022 10:30 AM Jethro Bastos, PTA [facility]   08/12/2022 10:30 AM Adsit, Dimas [place], PT [facility]   08/17/2022 10:30 AM Jethro Bastos, PTA [facility]   08/19/2022 10:30 AM Jethro Bastos, PTA [facility]   08/24/2022  9:50 AM Jethro Bastos, PTA [facility]   08/26/2022  9:50 AM Jethro Bastos, PTA [facility]   08/31/2022 10:30 AM Lucia Gaskins, PT [facility]   09/02/2022 10:30 AM Floyde Parkins, PT [facility]   09/07/2022  9:50 AM Jethro Bastos, PTA [facility]   09/09/2022  9:50 AM Jethro Bastos, PTA [facility]   09/14/2022  9:50 AM Jethro Bastos, PTA [facility]   09/16/2022  9:50 AM Jethro Bastos, PTA [facility]   09/21/2022  9:50 AM Floyde Parkins, PT [facility]   09/23/2022  9:50 AM Jethro Bastos, PTA [facility]   09/28/2022  9:50 AM Blase Mess [facility]   09/30/2022  9:50 AM Blase Mess [facility]    10/05/2022  9:50 AM Jethro Bastos, PTA [facility]

## 2022-07-15 NOTE — Telephone Encounter (Signed)
Patient coming to late to session once therapist called. Thought all patients were scheduled for 12:50.

## 2022-07-20 NOTE — Progress Notes (Signed)
PHYSICAL / OCCUPATIONAL THERAPY - DAILY TREATMENT NOTE     Patient Name: [name] Woods    Date: 07/20/2022    DOB: [birth year]  Insurance: Pay[place]: [place] / Plan: ANTHEM [place] EX[place] ANTHEM HEALTHKEEPERS PLUS / Product Type: *No Product type* /      Patient DOB verified Yes     Visit #   Current / Total 3 24   Time   In / Out 3:54 4:32   Pain   In / Out 0 0   Subjective Functional Status/Changes: Patient reports no pain over the past few days   Changes to:  Allergies, Med Hx, Sx Hx?   no       TREATMENT AREA =  Other low back pain [M54.59]    OBJECTIVE      Therapeutic Procedures:  Tx Min Billable or 1:1 Min (if diff from Tx Min) Procedure, Rationale, Specifics   15  97110 Therapeutic Exercise (timed):  increase ROM, strength, coordination, balance, and proprioception to improve patient's ability to progress to PLOF and address remaining functional goals. (see flow sheet as applicable)    Details if applicable:       13  16109 Neuromuscular Re-Education (timed):  improve balance, coordination, kinesthetic sense, posture, core stability and proprioception to improve patient's ability to develop conscious control of individual muscles and awareness of position of extremities in order to progress to PLOF and address remaining functional goals. (see flow sheet as applicable)    Details if applicable:     10  60454 Therapeutic Activity (timed):  use of dynamic activities replicating functional movements to increase ROM, strength, coordination, balance, and proprioception in order to improve patient's ability to progress to PLOF and address remaining functional goals.  (see flow sheet as applicable)     Details if applicable:           Details if applicable:            Details if applicable:     55  MC BC Totals Reminder: bill using total billable min of TIMED therapeutic procedures (example: do not include dry needle or estim unattended, both untimed codes, in totals to left)  8-22 min = 1 unit; 23-37 min = 2 units;  38-52 min = 3 units; 53-67 min = 4 units; 68-82 min = 5 units   Total Total     TOTAL TREATMENT TIME:        38     [x]   Patient Education billed concurrently with other procedures   [x]  Review HEP    []  Progressed/Changed HEP, detail:    []  Other detail:       Objective Information/Functional Measures/Assessment    Patient tolerated treatment session well today. Patient had no complaints with addition of TA march and standing hip abduction to exercise program to accomplish LE and core strengthening. Patient continues to make steady progress toward goals and would benefit from continued skilled PT intervention to address remaining deficits outlined in goals below.     Patient will continue to benefit from skilled PT / OT services to modify and progress therapeutic interventions, analyze and address functional mobility deficits, analyze and address ROM deficits, analyze and address strength deficits, analyze and address soft tissue restrictions, analyze and cue for proper movement patterns, and analyze and modify for postural abnormalities to address functional deficits and attain remaining goals.    Progress toward goals / Updated goals:  []   See Progress Note/Recertification  Short Term Goals: To be accomplished in 6 treatments:  Patient will be independent and compliant with HEP to progress toward goals and restore functional mobility.   Eval Status: will issue at followup visit 1 or 2  Current: 07/15/22: HEP issued this session  2/13: patient reports compliance with HEP     Patient will improve FOTO score by 5 points to improve functional tolerance for exercise.   Eval Status: FOTO 45  FOTO score = an established functional score where 100 = no disability     Patient will improve pain in back to 5/10 at worst to improve ADL and work duty tolerance and restore prior level of function.  Eval Status: 10/10 at worst     Pt will have 4+/5 bilateral LE and core strength to return to goals of completing work duties  without increased pain.  Eval Status:     Left(0-5) Right (0-5) N/T   Hip Flexion (L1,2) 5 5 []     Knee Extension (L3,4) 5 5 []     Ankle Dorsiflexion (L4) 5 5 []     Ankle Plantarflexion (S1) 5 5 []     Knee Flexion (S1,2) 5 5 []     Abdominals     [x]     Paraspinals     [x]     Back Rotators     [x]     Gluteus Maximus 4- 4- []     Hip Abduction 4 4 []           Pt will have painfree hip flexibility WFL to aid in functional mechanics for ambulation/ADLs.  Eval Status: Hip:         C[place]y Sox:              [x]  Right    [x]  Left    [x]  +    []  -                           Scour:              []  Right    []  Left    []  +    [x]  -                           Piriformis:        [x]  Right    [x]  Left    [x]  +    []  -                           Thomas:          [x]  Right    [x]  Left    [x]  +    []  -                           Obers:             [x]  Right    [x]  Left    [x]  +    []  -      Long Term Goals: To be accomplished in 24 treatments:  Patient will improve FOTO score by 10 points to improve functional tolerance for housework/work duty performance.  Eval Status: FOTO 45  FOTO score = an established functional score where 100 = no disability     2.   Pt will have 5/5 bilateral LE and core strength to return to goals of improved  pain with walking and prolonged standing.  Eval Status:     Left(0-5) Right (0-5) N/T   Hip Flexion (L1,2) 5 5 []     Knee Extension (L3,4) 5 5 []     Ankle Dorsiflexion (L4) 5 5 []     Ankle Plantarflexion (S1) 5 5 []     Knee Flexion (S1,2) 5 5 []     Abdominals     [x]     Paraspinals     [x]     Back Rotators     [x]     Gluteus Maximus 4- 4- []     Hip Abduction 4 4 []           3.   Patient will improve pain in back to 0/10 at worst to improve ADL and work duty tolerance and return to working out without pain.  Eval Status: 10/10 at worst    PLAN  Yes  Continue plan of care  []   Upgrade activities as tolerated  []   Discharge due to :  []   Other:    Duard Brady, PT    9/62/9528    3:41 PM    Future Appointments    Date Time Provider [place]   07/20/2022  4:13 PM Duard Brady, PT [facility]   07/22/2022  1:10 PM Blase Mess [facility]   07/27/2022  2:30 PM [facility]ase Mess [facility]   07/29/2022 10:30 AM Floyde Parkins, PT [facility]   08/03/2022 11:10 AM Jethro Bastos, PTA [facility]   08/05/2022 10:30 AM Jethro Bastos, PTA [facility]   08/10/2022 10:30 AM Jethro Bastos, PTA [facility]   08/12/2022 10:30 AM Adsit, Dimas [place], PT [facility]   08/17/2022 10:30 AM Jethro Bastos, PTA [facility]   08/19/2022 10:30 AM Jethro Bastos, PTA [facility]   08/24/2022  9:50 AM Jethro Bastos, PTA [facility]   08/26/2022  9:50 AM Jethro Bastos, PTA [facility]   08/31/2022 10:30 AM AdsitDimas Alexandria, PT [facility]   09/02/2022 10:30 AM Floyde Parkins, PT [facility]   09/07/2022  9:50 AM Jethro Bastos, PTA [facility]   09/09/2022  9:50 AM Jethro Bastos, PTA [facility]   09/14/2022  9:50 AM Jethro Bastos, PTA [facility]   09/16/2022  9:50 AM Jethro Bastos, PTA [facility]   09/21/2022  9:50 AM Floyde Parkins, PT [facility]   09/23/2022  9:50 AM Jethro Bastos, PTA [facility]   09/28/2022  9:50 AM Blase Mess [facility]   09/30/2022  9:50 AM Blase Mess [facility]   10/05/2022  9:50 AM Jethro Bastos, PTA [facility]

## 2022-07-22 ENCOUNTER — Inpatient Hospital Stay: Admit: 2022-07-22 | Payer: MEDICAID

## 2022-07-22 NOTE — Progress Notes (Signed)
PHYSICAL / OCCUPATIONAL THERAPY - DAILY TREATMENT NOTE     Patient Name: [name] Woods    Date: 07/22/2022    DOB: May 20, 1966  Insurance: [place] / Plan: [place] ANTHEM HEALTHKEEPERS PLUS / Product Type: *No Product type* /      Patient DOB verified Yes     Visit #   Current / Total 4 24   Time   In / Out 1:18 2:14   Pain   In / Out 0/10 0/10   Subjective Functional Status/Changes: "I don't have any pain right now."   Changes to:  Allergies, Med Hx, Sx Hx?   no       TREATMENT AREA =  Other low back pain [M54.59]    OBJECTIVE    Therapeutic Procedures:  Tx Min Billable or 1:1 Min (if diff from Tx Min) Procedure, Rationale, Specifics   21  97110 Therapeutic Exercise (timed):  increase ROM, strength, coordination, balance, and proprioception to improve patient's ability to progress to PLOF and address remaining functional goals. (see flow sheet as applicable)    Details if applicable:       10  123456 Neuromuscular Re-Education (timed):  improve balance, coordination, kinesthetic sense, posture, core stability and proprioception to improve patient's ability to develop conscious control of individual muscles and awareness of position of extremities in order to progress to PLOF and address remaining functional goals. (see flow sheet as applicable)    Details if applicable:  TA activation during postural correction and awareness with positional changes   15  97530 Therapeutic Activity (timed):  use of dynamic activities replicating functional movements to increase ROM, strength, coordination, balance, and proprioception in order to improve patient's ability to progress to PLOF and address remaining functional goals.  (see flow sheet as applicable)     Details if applicable:     10  [id number] Self Care/Home Management (timed):  improve patient knowledge and understanding of pain reducing techniques, positioning, and posture/ergonomics  to improve patient's ability to progress to PLOF and address  remaining functional goals.  (see flow sheet as applicable)    Details if applicable:            Details if applicable:     42  MC BC Totals Reminder: bill using total billable min of TIMED therapeutic procedures (example: do not include dry needle or estim unattended, both untimed codes, in totals to left)  8-22 min = 1 unit; 23-37 min = 2 units; 38-52 min = 3 units; 53-67 min = 4 units; 68-82 min = 5 units   Total Total     TOTAL TREATMENT TIME:        56     [x]$   Patient Education billed concurrently with other procedures   [x]$  Review HEP    [x]$  Progressed/Changed HEP, detail: Access Code: [email or web address]  URL: [email or web address]  Date: 07/22/2022  Prepared by: Dewitt Rota    Exercises  - Supine Transversus Abdominis Bracing - Hands on Stomach  - 1-2 x daily - 7 x weekly - 1 sets - 10 reps - 5 second holds hold  - Supine Hip Adduction Isometric with Ball  - 1-2 x daily - 7 x weekly - 1 sets - 10 reps - 5 seconds hold  - Hooklying Single Leg Bent Knee Fallouts with Resistance  - 1-2 x daily - 7 x weekly - 2 sets - 10 reps  - Standing Single Arm Shoulder Row with Anchored Resistance at  Chest Height  - 1 x daily - 7 x weekly - 2 sets - 10 reps  - Single Arm Shoulder Extension with Anchored Resistance  - 1 x daily - 7 x weekly - 2 sets - 10 reps  - Clam with Resistance  - 1 x daily - 7 x weekly - 3 sets - 10 reps  - Sit to Stand Without Arm Support  - 1 x daily - 7 x weekly - 3 sets - 10 reps  - Supine March with Posterior Pelvic Tilt  - 1 x daily - 7 x weekly - 3 sets - 10 reps  - Standing Hip Flexion with Resistance Loop  - 1 x daily - 7 x weekly - 3 sets - 10 reps  - Hip Abduction with Resistance Loop  - 1 x daily - 7 x weekly - 3 sets - 10 reps  - Hip Extension with Resistance Loop  - 1 x daily - 7 x weekly - 3 sets - 10 reps  []$  Other detail:       Objective Information/Functional Measures/Assessment    Pt arrived in clinic reporting no pain in low back at this time. Pt was able to tolerate  therex well with no increased pain but, did demonstrate difficulty with general TA and glute focused therex, presenting with moderate deconditioning. Pt reported greatly increased fatigue with therex but, not above tolerance. Pt was given updated HEP to continue to perform strengthening at home and to achieve maximum benefit from therapy. Pt will benefit from continued therapy to address unmet goals and to return to prior level of activity.       Patient will continue to benefit from skilled PT / OT services to modify and progress therapeutic interventions, analyze and address functional mobility deficits, analyze and address ROM deficits, analyze and address strength deficits, analyze and address soft tissue restrictions, analyze and cue for proper movement patterns, and analyze and modify for postural abnormalities to address functional deficits and attain remaining goals.    Progress toward goals / Updated goals:  []$   See Progress Note/Recertification    Short Term Goals: To be accomplished in 6 treatments:  Patient will be independent and compliant with HEP to progress toward goals and restore functional mobility.   Eval Status: will issue at followup visit 1 or 2  Current: 07/15/22: HEP issued this session  2/13: patient reports compliance with HEP     Patient will improve FOTO score by 5 points to improve functional tolerance for exercise.   Eval Status: FOTO 45  Current:   FOTO score = an established functional score where 100 = no disability     Patient will improve pain in back to 5/10 at worst to improve ADL and work duty tolerance and restore prior level of function.  Eval Status: 10/10 at worst   Current: Pt reports no pain in low back in the past several days 07/22/22    Pt will have 4+/5 bilateral LE and core strength to return to goals of completing work duties without increased pain.  Eval Status:     Left(0-5) Right (0-5) N/T   Hip Flexion (L1,2) 5 5 []$     Knee Extension (L3,4) 5 5 []$     Ankle  Dorsiflexion (L4) 5 5 []$     Ankle Plantarflexion (S1) 5 5 []$     Knee Flexion (S1,2) 5 5 []$     Abdominals     [x]$     Paraspinals     [  x]    Back Rotators     [x]$     Gluteus Maximus 4- 4- []$     Hip Abduction 4 4 []$           Pt will have painfree hip flexibility WFL to aid in functional mechanics for ambulation/ADLs.  Eval Status: Hip:         Corky Sox:              [x]$  Right    [x]$  Left    [x]$  +    []$  -                           Scour:              []$  Right    []$  Left    []$  +    [x]$  -                           Piriformis:        [x]$  Right    [x]$  Left    [x]$  +    []$  -                           Thomas:          [x]$  Right    [x]$  Left    [x]$  +    []$  -                           Obers:             [x]$  Right    [x]$  Left    [x]$  +    []$  -      Long Term Goals: To be accomplished in 24 treatments:  Patient will improve FOTO score by 10 points to improve functional tolerance for housework/work duty performance.  Eval Status: FOTO 45  FOTO score = an established functional score where 100 = no disability     2.   Pt will have 5/5 bilateral LE and core strength to return to goals of improved pain with walking and prolonged standing.  Eval Status:     Left(0-5) Right (0-5) N/T   Hip Flexion (L1,2) 5 5 []$     Knee Extension (L3,4) 5 5 []$     Ankle Dorsiflexion (L4) 5 5 []$     Ankle Plantarflexion (S1) 5 5 []$     Knee Flexion (S1,2) 5 5 []$     Abdominals     [x]$     Paraspinals     [x]$     Back Rotators     [x]$     Gluteus Maximus 4- 4- []$     Hip Abduction 4 4 []$           3.   Patient will improve pain in back to 0/10 at worst to improve ADL and work duty tolerance and return to working out without pain.  Eval Status: 10/10 at worst    PLAN  Yes  Continue plan of care  []$   Upgrade activities as tolerated  []$   Discharge due to :  []$   Other:    Jethro Bastos, PTA    07/22/2022    1:04 PM    Future Appointments   Date Time Provider [place]   07/22/2022  1:10 PM Blase [facility]   07/27/2022  2:30 PM Floyde Parkins, PT [facility]   07/29/2022 10:30 AM Floyde Parkins, PT [facility]   08/03/2022 11:10 AM Jethro Bastos, PTA [facility]   08/05/2022 10:30 AM Jethro Bastos, PTA [facility]   08/10/2022 10:30 AM Jethro Bastos, PTA [facility]   08/12/2022 10:30 AM Lucia Gaskins, PT [facility]   08/17/2022 10:30 AM Blase Mess [facility]   08/19/2022 10:30 AM [facility]   08/24/2022  9:50 AM [facility]   08/26/2022  9:50 AM Blase Mess [facility]   08/31/2022 10:30 AM Lucia Gaskins, PT [facility]   09/02/2022 10:30 AM Floyde Parkins, PT [facility]   09/07/2022  9:50 AM Jethro Bastos, PTA [facility]   09/09/2022  9:50 AM Jethro Bastos, PTA [facility]   09/14/2022  9:50 AM Jethro Bastos, PTA [facility]   09/16/2022  9:50 AM Jethro Bastos, PTA [facility]   09/21/2022  9:50 AM Floyde Parkins, PT [facility]   09/23/2022  9:50 AM Jethro Bastos, PTA [facility]   09/28/2022  9:50 AM Blase Mess [facility]   09/30/2022  9:50 AM [facility]ase Mess [facility]   10/05/2022  9:50 AM Jethro Bastos, PTA [facility]

## 2022-07-27 NOTE — Progress Notes (Signed)
PHYSICAL / OCCUPATIONAL THERAPY - DAILY TREATMENT NOTE     Patient Name: [name] Woods    Date: 07/27/2022    DOB: Feb 24, 1966  Insurance: Pay[place] / Plan: AN[place]M [place] ANTHEM HEALTHKEEPERS PLUS / Product Type: *No Product type* /      Patient DOB verified Yes     Visit #   Current / Total 5 24   Time   In / Out 2:35 3:25   Pain   In / Out 0/10 0/10   Subjective Functional Status/Changes: "I don't have any pain right now. I went to the gym and did a light workout on some machines. I did have a little pain when trying to use the Hamstring machine but, I think it was because I was laying bad."   Changes to:  Allergies, Med Hx, Sx Hx?   no       TREATMENT AREA =  Other low back pain [M54.59]    OBJECTIVE    Modalities Rationale:     decrease edema, decrease inflammation, and decrease pain to improve patient's ability to progress to PLOF and address remaining functional goals.     min []$  Estim Unattended, type/location:                                      []$   w/ice    []$   w/heat    min []$  Estim Attended, type/location:                                     []$   w/US     []$   w/ice    []$   w/heat    []$   TENS insruct      min []$   Mechanical Traction: type/lbs                   []$   pro   []$   sup   []$   int   []$   cont    []$   before manual    []$   after manual    min []$   Ultrasound, settings/location:      min []$   Iontophoresis w/ dexamethasone, location:                                               []$   take home patch       []$   in clinic        min  unbilled []$   Ice     []$   Heat    location/position:     min []$   Paraffin,  details:     min []$   Vasopneumatic Device, press/temp:     min []$   Whirlpool / Fluido:    If using vaso (only need to measure limb vaso being performed on)      pre-treatment girth :       post-treatment girth :       measured at (landmark location) :      min []$   Other:    Skin assessment post-treatment (if applicable):    [x]$   intact    []$   redness- no adverse reaction                  []$   redness - adverse reaction:         Therapeutic Procedures:  Tx Min Billable or 1:1 Min (if diff from Tx Min) Procedure, Rationale, Specifics   28  97110 Therapeutic Exercise (timed):  increase ROM, strength, coordination, balance, and proprioception to improve patient's ability to progress to PLOF and address remaining functional goals. (see flow sheet as applicable)    Details if applicable:         123456 Neuromuscular Re-Education (timed):  improve balance, coordination, kinesthetic sense, posture, core stability and proprioception to improve patient's ability to develop conscious control of individual muscles and awareness of position of extremities in order to progress to PLOF and address remaining functional goals. (see flow sheet as applicable)    Details if applicable:     10  0000000 Therapeutic Activity (timed):  use of dynamic activities replicating functional movements to increase ROM, strength, coordination, balance, and proprioception in order to improve patient's ability to progress to PLOF and address remaining functional goals.  (see flow sheet as applicable)     Details if applicable:     12  [id number] Self Care/Home Management (timed):  improve patient knowledge and understanding of pain reducing techniques, positioning, and posture/ergonomics  to improve patient's ability to progress to PLOF and address remaining functional goals.  (see flow sheet as applicable)    Details if applicable:            Details if applicable:     39  MC BC Totals Reminder: bill using total billable min of TIMED therapeutic procedures (example: do not include dry needle or estim unattended, both untimed codes, in totals to left)  8-22 min = 1 unit; 23-37 min = 2 units; 38-52 min = 3 units; 53-67 min = 4 units; 68-82 min = 5 units   Total Total     TOTAL TREATMENT TIME:        50     [x]$   Patient Education billed concurrently with other procedures   [x]$  Review HEP    []$  Progressed/Changed HEP, detail:    []$  Other detail:        Objective Information/Functional Measures/Assessment    Pt arrived in clinic reporting no pain in Low back at this time. Pt reports performing small gym circuit workout over the weekend, utilizing weight machines instead of free weights. Pt reported having no increased pain with exercises. Pt tolerate session moderately well with reports of increased fatigue and soreness but, not above tolerance. Pt initially performed STS from bed with poor form, in an angled posture with toes pointed near parallel from table. Pt was educated with proper body mechanics and forward weight shift to improve general posture and performance of sit to stands. Pt reported no increased pain post tx. Pt will benefit from continued therapy to address unmet goals and to return to prior level of activity.       Patient will continue to benefit from skilled PT / OT services to modify and progress therapeutic interventions, analyze and address functional mobility deficits, analyze and address ROM deficits, analyze and address strength deficits, analyze and address soft tissue restrictions, and analyze and cue for proper movement patterns to address functional deficits and attain remaining goals.    Progress toward goals / Updated goals:  []$   See Progress Note/Recertification    Short Term Goals: To be accomplished in 6 treatments:  Patient will be independent and compliant with HEP to progress toward goals and restore functional mobility.  Eval Status: will issue at followup visit 1 or 2  Current: pt educated with proper STS performance for HEP and daily performance 07/27/22     Patient will improve FOTO score by 5 points to improve functional tolerance for exercise.   Eval Status: FOTO 45  Current:   FOTO score = an established functional score where 100 = no disability     Patient will improve pain in back to 5/10 at worst to improve ADL and work duty tolerance and restore prior level of function.  Eval Status: 10/10 at worst   Current: Pt  reports no pain in low back in the past several days 07/22/22     Pt will have 4+/5 bilateral LE and core strength to return to goals of completing work duties without increased pain.  Eval Status:     Left(0-5) Right (0-5) N/T   Hip Flexion (L1,2) 5 5 []$     Knee Extension (L3,4) 5 5 []$     Ankle Dorsiflexion (L4) 5 5 []$     Ankle Plantarflexion (S1) 5 5 []$     Knee Flexion (S1,2) 5 5 []$     Abdominals     [x]$     Paraspinals     [x]$     Back Rotators     [x]$     Gluteus Maximus 4- 4- []$     Hip Abduction 4 4 []$     Current:         Pt will have painfree hip flexibility WFL to aid in functional mechanics for ambulation/ADLs.  Eval Status: Hip:         Corky Sox:              [x]$  Right    [x]$  Left    [x]$  +    []$  -                           Scour:              []$  Right    []$  Left    []$  +    [x]$  -                           Piriformis:        [x]$  Right    [x]$  Left    [x]$  +    []$  -                           Thomas:          [x]$  Right    [x]$  Left    [x]$  +    []$  -                           Obers:             [x]$  Right    [x]$  Left    [x]$  +    []$  -    Current:     Long Term Goals: To be accomplished in 24 treatments:  Patient will improve FOTO score by 10 points to improve functional tolerance for housework/work duty performance.  Eval Status: FOTO 45  FOTO score = an established functional score where 100 = no disability     2.   Pt will have 5/5 bilateral LE and core strength to return to goals of improved pain with walking  and prolonged standing.  Eval Status:     Left(0-5) Right (0-5) N/T   Hip Flexion (L1,2) 5 5 []$     Knee Extension (L3,4) 5 5 []$     Ankle Dorsiflexion (L4) 5 5 []$     Ankle Plantarflexion (S1) 5 5 []$     Knee Flexion (S1,2) 5 5 []$     Abdominals     [x]$     Paraspinals     [x]$     Back Rotators     [x]$     Gluteus Maximus 4- 4- []$     Hip Abduction 4 4 []$           3.   Patient will improve pain in back to 0/10 at worst to improve ADL and work duty tolerance and return to working out without pain.  Eval Status: 10/10 at  worst  Current:    PLAN  Yes  Continue plan of care  []$   Upgrade activities as tolerated  []$   Discharge due to :  []$   Other:    Jethro Bastos, PTA    07/27/2022    7:43 AM    Future Appointments   Date Time Provider [place]   07/27/2022  2:30 PM [facility]   07/29/2022 10:30 AM Floyde Parkins, PT [facility]   08/03/2022 11:10 AM Jethro Bastos, PTA [facility]   08/05/2022 10:30 AM Adsit, Dimas [place], PT [facility]   08/10/2022 10:30 AM Jethro Bastos, PTA [facility]   08/12/2022 10:30 AM Adsit, Dimas [place], PT [facility]   08/17/2022 10:30 AM Jethro Bastos, PTA [facility]   08/19/2022 10:30 AM Jethro Bastos, PTA [facility]   08/24/2022  9:50 AM Jethro Bastos, PTA [facility]   08/26/2022  9:50 AM Jethro Bastos, PTA [facility]   08/31/2022 10:30 AM Adsit, Dimas [place], PT [facility]   09/02/2022 10:30 AM Floyde Parkins, PT [facility]   09/07/2022  9:50 AM Jethro Bastos, PTA [facility]   09/09/2022  9:50 AM Jethro Bastos, PTA [facility]   09/14/2022  9:50 AM Jethro Bastos, PTA [facility]   09/16/2022  9:50 AM Jethro Bastos, PTA [facility]   09/21/2022  9:50 AM Floyde Parkins, PT [facility]   09/23/2022  9:50 AM Jethro Bastos, PTA [facility]   09/28/2022  9:50 AM Blase Mess [facility]   09/30/2022  9:50 AM Blase Mess [facility]   10/05/2022  9:50 AM Jethro Bastos, PTA [facility]

## 2022-07-29 ENCOUNTER — Inpatient Hospital Stay: Payer: MEDICAID

## 2022-07-29 NOTE — Telephone Encounter (Signed)
cxl due to not being able to get out of work

## 2022-08-03 ENCOUNTER — Inpatient Hospital Stay: Payer: MEDICAID

## 2022-08-03 NOTE — Telephone Encounter (Signed)
Pt No show appt. PTA called and pt reported they were sick from the weekend and forgot to call. Pt confirmed t2/29/24 appt.

## 2022-08-03 NOTE — Progress Notes (Incomplete)
PHYSICAL / OCCUPATIONAL THERAPY - DAILY TREATMENT NOTE     Patient Name: [name] Woods    Date: 08/03/2022    DOB: 09/21/1965  Insurance: [place] / Plan: [place] ANTHEM HEALTHKEEPERS PLUS / Product Type: *No Product type* /      Patient DOB verified Yes     Visit #   Current / Total 6 24   Time   In / Out *** ***   Pain   In / Out *** ***   Subjective Functional Status/Changes: ***   Changes to:  Allergies, Med Hx, Sx Hx?   no       TREATMENT AREA =  Other low back pain [M54.59]    OBJECTIVE    Modalities Rationale:     decrease edema, decrease inflammation, and decrease pain to improve patient's ability to progress to PLOF and address remaining functional goals.     min []$  Estim Unattended, type/location:                                      []$   w/ice    []$   w/heat    min []$  Estim Attended, type/location:                                     []$   w/US     []$   w/ice    []$   w/heat    []$   TENS insruct      min []$   Mechanical Traction: type/lbs                   []$   pro   []$   sup   []$   int   []$   cont    []$   before manual    []$   after manual    min []$   Ultrasound, settings/location:      min []$   Iontophoresis w/ dexamethasone, location:                                               []$   take home patch       []$   in clinic        min  unbilled []$   Ice     []$   Heat    location/position:     min []$   Paraffin,  details:     min []$   Vasopneumatic Device, press/temp:     min []$   Whirlpool / Fluido:    If using vaso (only need to measure limb vaso being performed on)      pre-treatment girth :       post-treatment girth :       measured at (landmark location) :      min []$   Other:    Skin assessment post-treatment (if applicable):    []$   intact    []$   redness- no adverse reaction                 []$ redness - adverse reaction:         Therapeutic Procedures:  Tx Min Billable or 1:1 Min (if diff from [employer]) Procedure, Rationale, Specifics   ***  [id number]  Therapeutic Exercise (timed):  increase ROM, strength,  coordination, balance, and proprioception to improve patient's ability to progress to PLOF and address remaining functional goals. (see flow sheet as applicable)    Details if applicable:       ***  123456 Neuromuscular Re-Education (timed):  improve balance, coordination, kinesthetic sense, posture, core stability and proprioception to improve patient's ability to develop conscious control of individual muscles and awareness of position of extremities in order to progress to PLOF and address remaining functional goals. (see flow sheet as applicable)    Details if applicable:     ***  0000000 Therapeutic Activity (timed):  use of dynamic activities replicating functional movements to increase ROM, strength, coordination, balance, and proprioception in order to improve patient's ability to progress to PLOF and address remaining functional goals.  (see flow sheet as applicable)     Details if applicable:       [id number] Self Care/Home Management (timed):  improve patient knowledge and understanding of positioning and posture/ergonomics  to improve patient's ability to progress to PLOF and address remaining functional goals.  (see flow sheet as applicable)    Details if applicable:            Details if applicable:     ***  [facility] BC Totals Reminder: bill using total billable min of TIMED therapeutic procedures (example: do not include dry needle or estim unattended, both untimed codes, in totals to left)  8-22 min = 1 unit; 23-37 min = 2 units; 38-52 min = 3 units; 53-67 min = 4 units; 68-82 min = 5 units   Total Total     TOTAL TREATMENT TIME:        ***     [x]$   Patient Education billed concurrently with other procedures   [x]$  Review HEP    []$  Progressed/Changed HEP, detail:    []$  Other detail:       Objective Information/Functional Measures/Assessment    ***    Patient will continue to benefit from skilled PT / OT services to modify and progress therapeutic interventions, analyze and address functional mobility deficits,  analyze and address ROM deficits, analyze and address strength deficits, analyze and address soft tissue restrictions, analyze and cue for proper movement patterns, and analyze and modify for postural abnormalities to address functional deficits and attain remaining goals.    Progress toward goals / Updated goals:  []$   See Progress Note/Recertification    Short Term Goals: To be accomplished in 6 treatments:  Patient will be independent and compliant with HEP to progress toward goals and restore functional mobility.   Eval Status: will issue at followup visit 1 or 2  Current: pt educated with proper STS performance for HEP and daily performance 07/27/22     Patient will improve FOTO score by 5 points to improve functional tolerance for exercise.   Eval Status: FOTO 45  Current:   FOTO score = an established functional score where 100 = no disability     Patient will improve pain in back to 5/10 at worst to improve ADL and work duty tolerance and restore prior level of function.  Eval Status: 10/10 at worst   Current: Pt reports no pain in low back in the past several days 07/22/22     Pt will have 4+/5 bilateral LE and core strength to return to goals of completing work duties without increased pain.  Eval Status:     Left(0-5) Right (0-5) N/T   Hip Flexion (  L1,2) 5 5 []$     Knee Extension (L3,4) 5 5 []$     Ankle Dorsiflexion (L4) 5 5 []$     Ankle Plantarflexion (S1) 5 5 []$     Knee Flexion (S1,2) 5 5 []$     Abdominals     [x]$     Paraspinals     [x]$     Back Rotators     [x]$     Gluteus Maximus 4- 4- []$     Hip Abduction 4 4 []$     Current:         Pt will have painfree hip flexibility WFL to aid in functional mechanics for ambulation/ADLs.  Eval Status: Hip:         Corky Sox:              [x]$  Right    [x]$  Left    [x]$  +    []$  -                           Scour:              []$  Right    []$  Left    []$  +    [x]$  -                           Piriformis:        [x]$  Right    [x]$  Left    [x]$  +    []$  -                           Thomas:           [x]$  Right    [x]$  Left    [x]$  +    []$  -                           Obers:             [x]$  Right    [x]$  Left    [x]$  +    []$  -    Current:      Long Term Goals: To be accomplished in 24 treatments:  Patient will improve FOTO score by 10 points to improve functional tolerance for housework/work duty performance.  Eval Status: FOTO 45  FOTO score = an established functional score where 100 = no disability     2.   Pt will have 5/5 bilateral LE and core strength to return to goals of improved pain with walking and prolonged standing.  Eval Status:     Left(0-5) Right (0-5) N/T   Hip Flexion (L1,2) 5 5 []$     Knee Extension (L3,4) 5 5 []$     Ankle Dorsiflexion (L4) 5 5 []$     Ankle Plantarflexion (S1) 5 5 []$     Knee Flexion (S1,2) 5 5 []$     Abdominals     [x]$     Paraspinals     [x]$     Back Rotators     [x]$     Gluteus Maximus 4- 4- []$     Hip Abduction 4 4 []$           3.   Patient will improve pain in back to 0/10 at worst to improve ADL and work duty tolerance and return to working out without pain.  Eval Status: 10/10 at worst  Current:    PLAN  Yes  Continue plan of care  []$   Upgrade activities as tolerated  []$   Discharge due to :  []$   Other:    Jethro Bastos, PTA    08/03/2022    7:41 AM    Future Appointments   Date Time Provider [place]   08/03/2022 11:10 AM Blase Mess [facility]   08/05/2022 10:30 AM Adsit, Dimas [place], PT [facility]   08/10/2022 10:30 AM Jethro Bastos, PTA [facility]   08/12/2022 10:30 AM Adsit, Dimas [place], PT [facility]   08/17/2022 10:30 AM Jethro Bastos, PTA [facility]   08/19/2022 10:30 AM Jethro Bastos, PTA [facility]   08/24/2022  9:50 AM Jethro Bastos, PTA [facility]   08/26/2022  9:50 AM Jethro Bastos, PTA [facility]   08/31/2022 10:30 AM AdsitDimas Alexandria, PT [facility]   09/02/2022 10:30 AM Floyde Parkins, PT [facility]   09/07/2022  9:50 AM Jethro Bastos, PTA [facility]   09/09/2022  9:50 AM Jethro Bastos, PTA [facility]   09/14/2022  9:50 AM Jethro Bastos, PTA [facility]    09/16/2022  9:50 AM Jethro Bastos, PTA [facility]   09/21/2022  9:50 AM Floyde Parkins, PT [facility]   09/23/2022  9:50 AM Jethro Bastos, PTA [facility]   09/28/2022  9:50 AM Blase Mess [facility]   09/30/2022  9:50 AM Blase Mess [facility]   10/05/2022  9:50 AM Jethro Bastos, PTA [facility]

## 2022-08-05 ENCOUNTER — Inpatient Hospital Stay: Admit: 2022-08-05 | Payer: MEDICAID

## 2022-08-05 NOTE — Progress Notes (Signed)
PHYSICAL / OCCUPATIONAL THERAPY - DAILY TREATMENT NOTE     Patient Name: [name] Woods    Date: 08/05/2022    DOB: 12/11/1965  Insurance: Pay[place] / Plan: AN[place] ANTHEM HEALTHKEEPERS PLUS / Product Type: *No Product type* /      Patient DOB verified Yes     Visit #   Current / Total 6 24   Time   In / Out 1:50 2:45   Pain   In / Out 0/10 0/10   Subjective Functional Status/Changes: "I don't have any pain right now."   Changes to:  Allergies, Med Hx, Sx Hx?   no       TREATMENT AREA =  Other low back pain [M54.59]    OBJECTIVE    Therapeutic Procedures:  Tx Min Billable or 1:1 Min (if diff from Tx Min) Procedure, Rationale, Specifics   20  [id number] Therapeutic Exercise (timed):  increase ROM, strength, coordination, balance, and proprioception to improve patient's ability to progress to PLOF and address remaining functional goals. (see flow sheet as applicable)    Details if applicable:       10  123456 Neuromuscular Re-Education (timed):  improve balance, coordination, kinesthetic sense, posture, core stability and proprioception to improve patient's ability to develop conscious control of individual muscles and awareness of position of extremities in order to progress to PLOF and address remaining functional goals. (see flow sheet as applicable)    Details if applicable:     15  0000000 Therapeutic Activity (timed):  use of dynamic activities replicating functional movements to increase ROM, strength, coordination, balance, and proprioception in order to improve patient's ability to progress to PLOF and address remaining functional goals.  (see flow sheet as applicable)     Details if applicable:     10  [id number] Self Care/Home Management (timed):  improve patient knowledge and understanding of pain reducing techniques, positioning, and posture/ergonomics  to improve patient's ability to progress to PLOF and address remaining functional goals.  (see flow sheet as applicable)    Details if applicable:             Details if applicable:     26  [facility] BC Totals Reminder: bill using total billable min of TIMED therapeutic procedures (example: do not include dry needle or estim unattended, both untimed codes, in totals to left)  8-22 min = 1 unit; 23-37 min = 2 units; 38-52 min = 3 units; 53-67 min = 4 units; 68-82 min = 5 units   Total Total     TOTAL TREATMENT TIME:        55     '[x]'$   Patient Education billed concurrently with other procedures   '[x]'$  Review HEP    '[]'$  Progressed/Changed HEP, detail:    '[]'$  Other detail:       Objective Information/Functional Measures/Assessment    Pt arrived in clinic reporting no pain in low back. Pt reports having return to light gym exercise utilizing stationary machines with no increased in pain. Pt continues to tolerate general therex well with no increased pain. Pt continues to require occasional VC for Valsalva avoidance but, able to perform pursed lip breathing to correct. Pt demonstrates continued difficulty with general sit to stand and squatting mechanics, requiring VC for positioning and forward weight shift. Pt reported no pain post tx. Pt will benefit from continued therapy to address unmet goals and to return to prior level of activity.  Patient will continue to benefit from skilled PT / OT services to modify and progress therapeutic interventions, analyze and address functional mobility deficits, analyze and address ROM deficits, analyze and address strength deficits, analyze and address soft tissue restrictions, analyze and cue for proper movement patterns, and analyze and modify for postural abnormalities to address functional deficits and attain remaining goals.    Progress toward goals / Updated goals:  '[]'$   See Progress Note/Recertification    Short Term Goals: To be accomplished in 6 treatments:  Patient will be independent and compliant with HEP to progress toward goals and restore functional mobility.   Eval Status: will issue at followup visit 1 or 2  Current:  pt educated with proper STS performance for HEP and daily performance 07/27/22     Patient will improve FOTO score by 5 points to improve functional tolerance for exercise.   Eval Status: FOTO 45  Current:   FOTO score = an established functional score where 100 = no disability     Patient will improve pain in back to 5/10 at worst to improve ADL and work duty tolerance and restore prior level of function.  Eval Status: 10/10 at worst   Current: Pt reports no pain in low back in the past several days 07/22/22     Pt will have 4+/5 bilateral LE and core strength to return to goals of completing work duties without increased pain.  Eval Status:     Left(0-5) Right (0-5) N/T   Hip Flexion (L1,2) 5 5 '[]'$     Knee Extension (L3,4) 5 5 '[]'$     Ankle Dorsiflexion (L4) 5 5 '[]'$     Ankle Plantarflexion (S1) 5 5 '[]'$     Knee Flexion (S1,2) 5 5 '[]'$     Abdominals     '[x]'$     Paraspinals     '[x]'$     Back Rotators     '[x]'$     Gluteus Maximus 4- 4- '[]'$     Hip Abduction 4 4 '[]'$     Current:         Pt will have painfree hip flexibility WFL to aid in functional mechanics for ambulation/ADLs.  Eval Status: Hip:         Corky Sox:              '[x]'$  Right    '[x]'$  Left    '[x]'$  +    '[]'$  -                           Scour:              '[]'$  Right    '[]'$  Left    '[]'$  +    '[x]'$  -                           Piriformis:        '[x]'$  Right    '[x]'$  Left    '[x]'$  +    '[]'$  -                           Thomas:          '[x]'$  Right    '[x]'$  Left    '[x]'$  +    '[]'$  -  Obers:             '[x]'$  Right    '[x]'$  Left    '[x]'$  +    '[]'$  -    Current:      Long Term Goals: To be accomplished in 24 treatments:  Patient will improve FOTO score by 10 points to improve functional tolerance for housework/work duty performance.  Eval Status: FOTO 45  Current:   FOTO score = an established functional score where 100 = no disability     2.   Pt will have 5/5 bilateral LE and core strength to return to goals of improved pain with walking and prolonged standing.  Eval Status:     Left(0-5) Right  (0-5) N/T   Hip Flexion (L1,2) 5 5 '[]'$     Knee Extension (L3,4) 5 5 '[]'$     Ankle Dorsiflexion (L4) 5 5 '[]'$     Ankle Plantarflexion (S1) 5 5 '[]'$     Knee Flexion (S1,2) 5 5 '[]'$     Abdominals     '[x]'$     Paraspinals     '[x]'$     Back Rotators     '[x]'$     Gluteus Maximus 4- 4- '[]'$     Hip Abduction 4 4 '[]'$     Current:       3.   Patient will improve pain in back to 0/10 at worst to improve ADL and work duty tolerance and return to working out without pain.  Eval Status: 10/10 at worst  Current: 0/10 pain in the past week 08/05/22       PLAN  Yes  Continue plan of care  '[]'$   Upgrade activities as tolerated  '[]'$   Discharge due to :  '[]'$   Other:    Jethro Bastos, PTA    08/05/2022    1:54 PM    Future Appointments   Date Time Provider [place]   08/10/2022 10:30 AM Blase Mess [facility]   08/12/2022 10:30 AM Adsit, Dimas [place], PT [facility]   08/17/2022 10:30 AM Jethro Bastos, PTA [facility]   08/19/2022 10:30 AM Jethro Bastos, PTA [facility]   08/24/2022  9:50 AM Jethro Bastos, PTA [facility]   08/26/2022  9:50 AM Jethro Bastos, PTA [facility]   08/31/2022 10:30 AM Adsit, Dimas [place], PT [facility]   09/02/2022 10:30 AM Floyde Parkins, PT [facility]   09/07/2022  9:50 AM Jethro Bastos, PTA [facility]   09/09/2022  9:50 AM Jethro Bastos, PTA [facility]   09/14/2022  9:50 AM Jethro Bastos, PTA [facility]   09/16/2022  9:50 AM Jethro Bastos, PTA [facility]   09/21/2022  9:50 AM Floyde Parkins, PT [facility]   09/23/2022  9:50 AM Jethro Bastos, PTA [facility]   09/28/2022  9:50 AM Jethro Bastos, PTA [facility]   09/30/2022  9:50 AM Jethro Bastos, PTA [facility]   10/05/2022  9:50 AM Jethro Bastos, PTA [facility]     \

## 2022-08-10 ENCOUNTER — Inpatient Hospital Stay: Admit: 2022-08-10 | Payer: MEDICAID

## 2022-08-10 DIAGNOSIS — M5459 Other low back pain: Secondary | ICD-10-CM

## 2022-08-10 NOTE — Progress Notes (Signed)
[facility]  [address] [place] [address]  Ph [phone]  Fx [phone]    Physical Therapy Discharge Summary    Patient name: [name] Woods Start of Care: 07/13/2022   Referral source: Elyn Peers, APRN - * DOB: 02-17-1966               Medical Diagnosis: Other low back pain [M54.59]    Onset Date:05/10/2022               Treatment Diagnosis: M54.59  OTHER LOWER BACK PAIN     Prior Hospitalization: see medical history Provider#: [id number]   Medications: Verified on Patient summary List   Comorbidities:  arthroscopic left knee surgery, left TKA, osteotomy on right leg, spinal injections, spinal nerve root ablations, right RTC tear, 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   Eval Status: will issue at followup visit 1 or 2  Current: Pt has returned to several days of week exercise at local gym with no increased pain 08/10/22 MET     Patient will improve FOTO score by 5 points to improve functional tolerance for exercise.   Eval Status: FOTO 45  Current: FOTO 70 08/10/22 MET  FOTO score = an established functional score where 100 = no disability     Patient will improve pain in back to 5/10 at worst to improve ADL and work duty tolerance and restore prior level of function.  Eval Status: 10/10 at worst   Current: pt reports mostly no pain with occasional 4/10 after high levels of exertion 08/10/22 MET     Pt will have 4+/5 bilateral LE and core strength to return to goals of completing work duties without increased pain.  Eval Status:     Left(0-5) Right (0-5) N/T   Hip Flexion (L1,2) 5 5 '[]'$     Knee Extension (L3,4) 5 5 '[]'$     Ankle  Dorsiflexion (L4) 5 5 '[]'$     Ankle Plantarflexion (S1) 5 5 '[]'$     Knee Flexion (S1,2) 5 5 '[]'$     Abdominals     '[x]'$     Paraspinals     '[x]'$     Back Rotators     '[x]'$     Gluteus Maximus 4- 4- '[]'$     Hip Abduction 4 4 '[]'$     Current: Grossly 5/5 except for Gluteus maximus at 4+/5 08/10/22 MET        Pt will have painfree hip flexibility WFL to aid in functional mechanics for ambulation/ADLs.  Eval Status: Hip:         Corky Sox:              '[x]'$  Right    '[x]'$  Left    '[x]'$  +    '[]'$  -                           Scour:              '[]'$  Right    '[]'$  Left    '[]'$  +    [  x] -                           Piriformis:        '[x]'$  Right    '[x]'$  Left    '[x]'$  +    '[]'$  -                           Thomas:          '[x]'$  Right    '[x]'$  Left    '[x]'$  +    '[]'$  -                           Obers:             '[x]'$  Right    '[x]'$  Left    '[x]'$  +    '[]'$  -    Current: Hip:         Faber:              '[x]'$  Right    '[x]'$  Left    '[x]'$  +    '[]'$  -                           Scour:              '[]'$  Right    '[]'$  Left    '[]'$  +    '[x]'$  -                           Piriformis:        '[x]'$  Right    '[x]'$  Left    '[x]'$  +    '[]'$  -                           Thomas:          '[x]'$  Right    '[x]'$  Left    '[]'$  +    '[x]'$  -                           Obers:             '[x]'$  Right    '[x]'$  Left    '[]'$  +    '[x]'$  -                            08/10/22 Progressing     Long Term Goals: To be accomplished in 24 treatments:  Patient will improve FOTO score by 10 points to improve functional tolerance for housework/work duty performance.  Eval Status: FOTO 45  Current: FOTO 70 08/10/22 MET  FOTO score = an established functional score where 100 = no disability     2.   Pt will have 5/5 bilateral LE and core strength to return to goals of improved pain with walking and prolonged standing.  Eval Status:     Left(0-5) Right (0-5) N/T   Hip Flexion (L1,2) 5 5 '[]'$     Knee Extension (L3,4) 5 5 '[]'$     Ankle Dorsiflexion (L4) 5 5 '[]'$     Ankle Plantarflexion (S1) 5 5 '[]'$     Knee Flexion (S1,2) 5 5 '[]'$     Abdominals     '[x]'$   Paraspinals     '[x]'$     Back  Rotators     '[x]'$     Gluteus Maximus 4- 4- '[]'$     Hip Abduction 4 4 '[]'$     Current:  Grossly 5/5 except for Gluteus maximus at 4+/5 08/10/22 Nearly met     3.   Patient will improve pain in back to 0/10 at worst to improve ADL and work duty tolerance and return to working out without pain.  Eval Status: 10/10 at worst  Current: 0/10 most of the time with occasional 4/10 with high levels of exertion 08/10/22 Progressing         Assessment/ Summary of Care: Pt has attended 7 visits of physical therapy for Other Low back pain secondary to T10-Pelvic Fusion on 05/11/23. Pt has demonstrate great progress towards goals established at eval. Pt has met nearly all short term goals and is progressing independently with HEP. Pt reports attending local gym multiple times a week with no pain. Pt demonstrates improved FOTO score meeting all goals. Pt reports readiness to discharge at this time. Due to pt meeting significant number of goals and making independent progress towards unmet goals, pt is appropriate for discharge to independent HEP.      Patient will continue to benefit from skilled PT / OT services to modify and progress therapeutic interventions, analyze and address functional mobility deficits, analyze and address ROM deficits, analyze and address strength deficits, analyze and address soft tissue restrictions, and analyze and cue for proper movement patterns to address functional deficits and attain remaining goals.      RECOMMENDATIONS:  '[x]'$ Discontinue therapy: '[x]'$ Patient has reached or is progressing toward set goals      '[]'$ Patient is non-compliant or has abdicated      '[]'$ Due to lack of appreciable progress towards set goals    Faigy Stretch, PT 08/12/2022 3:02 PM    ------------------------------------------------------------------------------------------------------------------------------  NOTE TO PHYSICIAN:  Please complete the following and fax to:   [facility] at [phone]  If you are unable  to process this request in   24 hours, please contact our office.     '[]'$  I have read the above report and request that my patient continue therapy with the following changes/special instructions:  '[]'$  I have read the above report and request that my patient be discharged from therapy    Physician's Signature:____________________ Date:_________ TIME:________                                      Elyn Peers, APRN - *      ** Signature, Date and Time must be completed for valid certification **

## 2022-08-10 NOTE — Progress Notes (Signed)
Physical Therapy Discharge Instructions    [facility]  [address] [place] [address]  Ph [phone]  Fx [phone]      Patient: [name] Woods  DOB: 1966-01-29      Continue Home Exercise Program 1 times per day for 1 weeks, then decrease to 3-4 times per week      Continue with    '[x]'$  Ice  as needed      '[x]'$  Heat           Follow up with MD:     '[]'$  Upon completion of therapy     '[x]'$  As needed      Recommendations:     '[x]'$    Return to activity with home program    '[]'$    Return to activity with the following modifications:       '[]'$ Post Rehab Program    '[]'$ Join Independent aquatic program     '[]'$ Return to/join local gym        Additional Comments:           Jethro Bastos, PTA 08/10/2022 12:18 PM

## 2022-08-10 NOTE — Progress Notes (Signed)
PHYSICAL / OCCUPATIONAL THERAPY - DAILY TREATMENT NOTE     Patient Name: [name] Woods    Date: 08/10/2022    DOB: Jul 20, 1965  Insurance: [place] / Plan: [place] ANTHEM HEALTHKEEPERS PLUS / Product Type: *No Product type* /      Patient DOB verified Yes     Visit #   Current / Total 7 24   Time   In / Out 10:42 11:06   Pain   In / Out 0/10 0/10   Subjective Functional Status/Changes: "I don't have any pain. I think I'm good to be done."   Changes to:  Allergies, Med Hx, Sx Hx?   no       TREATMENT AREA =  Other low back pain [M54.59]    OBJECTIVE    Therapeutic Procedures:  Tx Min Billable or 1:1 Min (if diff from Tx Min) Procedure, Rationale, Specifics     97110 Therapeutic Exercise (timed):  increase ROM, strength, coordination, balance, and proprioception to improve patient's ability to progress to PLOF and address remaining functional goals. (see flow sheet as applicable)    Details if applicable:         123456 Neuromuscular Re-Education (timed):  improve balance, coordination, kinesthetic sense, posture, core stability and proprioception to improve patient's ability to develop conscious control of individual muscles and awareness of position of extremities in order to progress to PLOF and address remaining functional goals. (see flow sheet as applicable)    Details if applicable:     14  0000000 Therapeutic Activity (timed):  use of dynamic activities replicating functional movements to increase ROM, strength, coordination, balance, and proprioception in order to improve patient's ability to progress to PLOF and address remaining functional goals.  (see flow sheet as applicable)     Details if applicable:     10  [id number] Self Care/Home Management (timed):  improve patient knowledge and understanding of pain reducing techniques, positioning, and posture/ergonomics  to improve patient's ability to progress to PLOF and address remaining functional goals.  (see flow sheet as  applicable)    Details if applicable:            Details if applicable:     24  [facility] Totals Reminder: bill using total billable min of TIMED therapeutic procedures (example: do not include dry needle or estim unattended, both untimed codes, in totals to left)  8-22 min = 1 unit; 23-37 min = 2 units; 38-52 min = 3 units; 53-67 min = 4 units; 68-82 min = 5 units   Total Total     TOTAL TREATMENT TIME:        24     '[x]'$   Patient Education billed concurrently with other procedures   '[x]'$  Review HEP    '[]'$  Progressed/Changed HEP, detail:    '[]'$  Other detail:       Objective Information/Functional Measures/Assessment    Pt has attended 7 visits of physical therapy for Other Low back pain secondary to T10-Pelvic Fusion on 05/11/23. Pt has demonstrate great progress towards goals established at eval. Pt has met nearly all short term goals and is progressing independently with HEP. Pt reports attending local gym multiple times a week with no pain. Pt demonstrates improved FOTO score meeting all goals. Pt reports readiness to discharge at this time. Due to pt meeting significant number of goals and making independent progress towards unmet goals, pt is appropriate for discharge to independent HEP.  Patient will continue to benefit from skilled PT / OT services to modify and progress therapeutic interventions, analyze and address functional mobility deficits, analyze and address ROM deficits, analyze and address strength deficits, analyze and address soft tissue restrictions, and analyze and cue for proper movement patterns to address functional deficits and attain remaining goals.    Progress toward goals / Updated goals:  '[]'$   See Progress Note/Recertification    Short Term Goals: To be accomplished in 6 treatments:  Patient will be independent and compliant with HEP to progress toward goals and restore functional mobility.   Eval Status: will issue at followup visit 1 or 2  Current: Pt has returned to several days of week  exercise at local gym with no increased pain 08/10/22 MET     Patient will improve FOTO score by 5 points to improve functional tolerance for exercise.   Eval Status: FOTO 45  Current: FOTO 70 08/10/22 MET  FOTO score = an established functional score where 100 = no disability     Patient will improve pain in back to 5/10 at worst to improve ADL and work duty tolerance and restore prior level of function.  Eval Status: 10/10 at worst   Current: pt reports mostly no pain with occasional 4/10 after high levels of exertion 08/10/22 MET     Pt will have 4+/5 bilateral LE and core strength to return to goals of completing work duties without increased pain.  Eval Status:     Left(0-5) Right (0-5) N/T   Hip Flexion (L1,2) 5 5 '[]'$     Knee Extension (L3,4) 5 5 '[]'$     Ankle Dorsiflexion (L4) 5 5 '[]'$     Ankle Plantarflexion (S1) 5 5 '[]'$     Knee Flexion (S1,2) 5 5 '[]'$     Abdominals     '[x]'$     Paraspinals     '[x]'$     Back Rotators     '[x]'$     Gluteus Maximus 4- 4- '[]'$     Hip Abduction 4 4 '[]'$     Current: Grossly 5/5 except for Gluteus maximus at 4+/5 08/10/22 MET        Pt will have painfree hip flexibility WFL to aid in functional mechanics for ambulation/ADLs.  Eval Status: Hip:         Corky Sox:              '[x]'$  Right    '[x]'$  Left    '[x]'$  +    '[]'$  -                           Scour:              '[]'$  Right    '[]'$  Left    '[]'$  +    '[x]'$  -                           Piriformis:        '[x]'$  Right    '[x]'$  Left    '[x]'$  +    '[]'$  -                           Thomas:          '[x]'$  Right    '[x]'$  Left    '[x]'$  +    '[]'$  -  Obers:             '[x]'$  Right    '[x]'$  Left    '[x]'$  +    '[]'$  -    Current: Hip:         Faber:              '[x]'$  Right    '[x]'$  Left    '[x]'$  +    '[]'$  -                           Scour:              '[]'$  Right    '[]'$  Left    '[]'$  +    '[x]'$  -                           Piriformis:        '[x]'$  Right    '[x]'$  Left    '[x]'$  +    '[]'$  -                           Thomas:          '[x]'$  Right    '[x]'$  Left    '[]'$  +    '[x]'$  -                           Obers:              '[x]'$  Right    '[x]'$  Left    '[]'$  +    '[x]'$  -      08/10/22 Progressing    Long Term Goals: To be accomplished in 24 treatments:  Patient will improve FOTO score by 10 points to improve functional tolerance for housework/work duty performance.  Eval Status: FOTO 45  Current: FOTO 70 08/10/22 MET  FOTO score = an established functional score where 100 = no disability     2.   Pt will have 5/5 bilateral LE and core strength to return to goals of improved pain with walking and prolonged standing.  Eval Status:     Left(0-5) Right (0-5) N/T   Hip Flexion (L1,2) 5 5 '[]'$     Knee Extension (L3,4) 5 5 '[]'$     Ankle Dorsiflexion (L4) 5 5 '[]'$     Ankle Plantarflexion (S1) 5 5 '[]'$     Knee Flexion (S1,2) 5 5 '[]'$     Abdominals     '[x]'$     Paraspinals     '[x]'$     Back Rotators     '[x]'$     Gluteus Maximus 4- 4- '[]'$     Hip Abduction 4 4 '[]'$     Current:  Grossly 5/5 except for Gluteus maximus at 4+/5 08/10/22 Nearly met     3.   Patient will improve pain in back to 0/10 at worst to improve ADL and work duty tolerance and return to working out without pain.  Eval Status: 10/10 at worst  Current: 0/10 most of the time with occasional 4/10 with high levels of exertion 08/10/22 Progressing       PLAN  No  Continue plan of care  '[]'$   Upgrade activities as tolerated  '[x]'$   Discharge due to : Independently progressing towards unmet goals/Self-Dishcarge  '[]'$   Other:    Jethro Bastos, PTA    08/10/2022  7:54 AM    Future Appointments   Date Time Provider [place]   08/10/2022 10:30 AM Blase Mess [facility]   08/12/2022 10:30 AM Lucia Gaskins, PT [facility]   08/17/2022 10:30 AM Elizabeth Palau, PT [facility]   08/19/2022 10:30 AM Lucia Gaskins, PT [facility]   08/24/2022  9:50 AM Jethro Bastos, PTA [facility]   08/26/2022  9:50 AM Jethro Bastos, PTA [facility]   08/31/2022 10:30 AM Lucia Gaskins, PT [facility]   09/02/2022 10:30 AM Floyde Parkins, PT [facility]   09/07/2022  9:50 AM Jethro Bastos, PTA [facility]   09/09/2022  9:50 AM Jethro Bastos, PTA  [facility]   09/14/2022  9:50 AM Jethro Bastos, PTA [facility]   09/16/2022  9:50 AM Jethro Bastos, PTA [facility]   09/21/2022  9:50 AM Floyde Parkins, PT [facility]   09/23/2022  9:50 AM Jethro Bastos, PTA [facility]   09/28/2022  9:50 AM Blase Mess [facility]   09/30/2022  9:50 AM Blase Mess [facility]   10/05/2022  9:50 AM Jethro Bastos, PTA [facility]
